# Patient Record
Sex: Male | Born: 1970 | Race: White | Hispanic: No | Marital: Married | Smoking: Never smoker
Health system: Southern US, Community
[De-identification: ages and names within clinical notes are randomized; demographics above are authoritative.]

## PROBLEM LIST (undated history)

## (undated) DIAGNOSIS — I82409 Acute embolism and thrombosis of unspecified deep veins of unspecified lower extremity: Secondary | ICD-10-CM

## (undated) DIAGNOSIS — I2699 Other pulmonary embolism without acute cor pulmonale: Secondary | ICD-10-CM

## (undated) HISTORY — PX: NO PAST SURGERIES: SHX2092

## (undated) HISTORY — DX: Acute embolism and thrombosis of unspecified deep veins of unspecified lower extremity: I82.409

## (undated) HISTORY — DX: Other pulmonary embolism without acute cor pulmonale: I26.99

---

## 2010-05-29 ENCOUNTER — Ambulatory Visit: Payer: Self-pay | Admitting: Internal Medicine

## 2013-10-03 ENCOUNTER — Ambulatory Visit: Payer: Self-pay | Admitting: Otolaryngology

## 2014-03-06 DIAGNOSIS — H918X1 Other specified hearing loss, right ear: Secondary | ICD-10-CM | POA: Insufficient documentation

## 2014-03-06 DIAGNOSIS — IMO0001 Reserved for inherently not codable concepts without codable children: Secondary | ICD-10-CM | POA: Insufficient documentation

## 2014-07-20 ENCOUNTER — Ambulatory Visit: Payer: Self-pay | Admitting: Family Medicine

## 2015-12-23 ENCOUNTER — Ambulatory Visit (INDEPENDENT_AMBULATORY_CARE_PROVIDER_SITE_OTHER): Payer: Managed Care, Other (non HMO) | Admitting: Family Medicine

## 2015-12-23 ENCOUNTER — Encounter: Payer: Self-pay | Admitting: Family Medicine

## 2015-12-23 DIAGNOSIS — Z0000 Encounter for general adult medical examination without abnormal findings: Secondary | ICD-10-CM

## 2015-12-23 DIAGNOSIS — Z0001 Encounter for general adult medical examination with abnormal findings: Secondary | ICD-10-CM | POA: Insufficient documentation

## 2015-12-23 NOTE — Assessment & Plan Note (Signed)
Declines flu.  States Tetanus is up to date. Patient to bring in screening labs from work.

## 2015-12-23 NOTE — Patient Instructions (Signed)
Get me a copy of your lab results.  Follow up annually (or sooner if needed).  Take care  Dr. Adriana Simasook   Health Maintenance, Male A healthy lifestyle and preventative care can promote health and wellness.  Maintain regular health, dental, and eye exams.  Eat a healthy diet. Foods like vegetables, fruits, whole grains, low-fat dairy products, and lean protein foods contain the nutrients you need and are low in calories. Decrease your intake of foods high in solid fats, added sugars, and salt. Get information about a proper diet from your health care provider, if necessary.  Regular physical exercise is one of the most important things you can do for your health. Most adults should get at least 150 minutes of moderate-intensity exercise (any activity that increases your heart rate and causes you to sweat) each week. In addition, most adults need muscle-strengthening exercises on 2 or more days a week.   Maintain a healthy weight. The body mass index (BMI) is a screening tool to identify possible weight problems. It provides an estimate of body fat based on height and weight. Your health care provider can find your BMI and can help you achieve or maintain a healthy weight. For males 20 years and older:  A BMI below 18.5 is considered underweight.  A BMI of 18.5 to 24.9 is normal.  A BMI of 25 to 29.9 is considered overweight.  A BMI of 30 and above is considered obese.  Maintain normal blood lipids and cholesterol by exercising and minimizing your intake of saturated fat. Eat a balanced diet with plenty of fruits and vegetables. Blood tests for lipids and cholesterol should begin at age 43 and be repeated every 5 years. If your lipid or cholesterol levels are high, you are over age 43, or you are at high risk for heart disease, you may need your cholesterol levels checked more frequently.Ongoing high lipid and cholesterol levels should be treated with medicines if diet and exercise are not  working.  If you smoke, find out from your health care provider how to quit. If you do not use tobacco, do not start.  Lung cancer screening is recommended for adults aged 55-80 years who are at high risk for developing lung cancer because of a history of smoking. A yearly low-dose CT scan of the lungs is recommended for people who have at least a 30-pack-year history of smoking and are current smokers or have quit within the past 15 years. A pack year of smoking is smoking an average of 1 pack of cigarettes a day for 1 year (for example, a 30-pack-year history of smoking could mean smoking 1 pack a day for 30 years or 2 packs a day for 15 years). Yearly screening should continue until the smoker has stopped smoking for at least 15 years. Yearly screening should be stopped for people who develop a health problem that would prevent them from having lung cancer treatment.  If you choose to drink alcohol, do not have more than 2 drinks per day. One drink is considered to be 12 oz (360 mL) of beer, 5 oz (150 mL) of wine, or 1.5 oz (45 mL) of liquor.  Avoid the use of street drugs. Do not share needles with anyone. Ask for help if you need support or instructions about stopping the use of drugs.  High blood pressure causes heart disease and increases the risk of stroke. High blood pressure is more likely to develop in:  People who have blood pressure  in the end of the normal range (100-139/85-89 mm Hg).  People who are overweight or obese.  People who are African American.  If you are 22-61 years of age, have your blood pressure checked every 3-5 years. If you are 30 years of age or older, have your blood pressure checked every year. You should have your blood pressure measured twice-once when you are at a hospital or clinic, and once when you are not at a hospital or clinic. Record the average of the two measurements. To check your blood pressure when you are not at a hospital or clinic, you can  use:  An automated blood pressure machine at a pharmacy.  A home blood pressure monitor.  If you are 24-104 years old, ask your health care provider if you should take aspirin to prevent heart disease.  Diabetes screening involves taking a blood sample to check your fasting blood sugar level. This should be done once every 3 years after age 34 if you are at a normal weight and without risk factors for diabetes. Testing should be considered at a younger age or be carried out more frequently if you are overweight and have at least 1 risk factor for diabetes.  Colorectal cancer can be detected and often prevented. Most routine colorectal cancer screening begins at the age of 11 and continues through age 26. However, your health care provider may recommend screening at an earlier age if you have risk factors for colon cancer. On a yearly basis, your health care provider may provide home test kits to check for hidden blood in the stool. A small camera at the end of a tube may be used to directly examine the colon (sigmoidoscopy or colonoscopy) to detect the earliest forms of colorectal cancer. Talk to your health care provider about this at age 34 when routine screening begins. A direct exam of the colon should be repeated every 5-10 years through age 47, unless early forms of precancerous polyps or small growths are found.  People who are at an increased risk for hepatitis B should be screened for this virus. You are considered at high risk for hepatitis B if:  You were born in a country where hepatitis B occurs often. Talk with your health care provider about which countries are considered high risk.  Your parents were born in a high-risk country and you have not received a shot to protect against hepatitis B (hepatitis B vaccine).  You have HIV or AIDS.  You use needles to inject street drugs.  You live with, or have sex with, someone who has hepatitis B.  You are a man who has sex with other  men (MSM).  You get hemodialysis treatment.  You take certain medicines for conditions like cancer, organ transplantation, and autoimmune conditions.  Hepatitis C blood testing is recommended for all people born from [birth year] through 1963 and any individual with known risk factors for hepatitis C.  Healthy men should no longer receive prostate-specific antigen (PSA) blood tests as part of routine cancer screening. Talk to your health care provider about prostate cancer screening.  Testicular cancer screening is not recommended for adolescents or adult males who have no symptoms. Screening includes self-exam, a health care provider exam, and other screening tests. Consult with your health care provider about any symptoms you have or any concerns you have about testicular cancer.  Practice safe sex. Use condoms and avoid high-risk sexual practices to reduce the spread of sexually transmitted infections (STIs).  You should be screened for STIs, including gonorrhea and chlamydia if:  You are sexually active and are younger than 24 years.  You are older than 24 years, and your health care provider tells you that you are at risk for this type of infection.  Your sexual activity has changed since you were last screened, and you are at an increased risk for chlamydia or gonorrhea. Ask your health care provider if you are at risk.  If you are at risk of being infected with HIV, it is recommended that you take a prescription medicine daily to prevent HIV infection. This is called pre-exposure prophylaxis (PrEP). You are considered at risk if:  You are a man who has sex with other men (MSM).  You are a heterosexual man who is sexually active with multiple partners.  You take drugs by injection.  You are sexually active with a partner who has HIV.  Talk with your health care provider about whether you are at high risk of being infected with HIV. If you choose to begin PrEP, you should first be tested  for HIV. You should then be tested every 3 months for as long as you are taking PrEP.  Use sunscreen. Apply sunscreen liberally and repeatedly throughout the day. You should seek shade when your shadow is shorter than you. Protect yourself by wearing long sleeves, pants, a wide-brimmed hat, and sunglasses year round whenever you are outdoors.  Tell your health care provider of new moles or changes in moles, especially if there is a change in shape or color. Also, tell your health care provider if a mole is larger than the size of a pencil eraser.  A one-time screening for abdominal aortic aneurysm (AAA) and surgical repair of large AAAs by ultrasound is recommended for men aged 15-75 years who are current or former smokers.  Stay current with your vaccines (immunizations). This information is not intended to replace advice given to you by your health care provider. Make sure you discuss any questions you have with your health care provider. Document Released: 07/18/2007 Document Revised: 02/09/2014 Document Reviewed: 10/23/2014 Elsevier Interactive Patient Education  2017 [employer].

## 2015-12-23 NOTE — Progress Notes (Signed)
Pre visit review using our clinic review tool, if applicable. No additional management support is needed unless otherwise documented below in the visit note. 

## 2015-12-23 NOTE — Progress Notes (Signed)
   Subjective:  Patient ID: Benjamin Dyer, male    DOB: 06-12-1970  Age: 45 y.o. MRN: [id number]  CC: Establish careHall Busing/physical exam  HPI Hall Busingravis Beau Dyer is a 45 y.o. male presents to the clinic today to establish care. He is in need of physical exam.  Preventative Healthcare  Colonoscopy: Not indicated.   Immunizations  Tetanus - States it was in the last 10 years.  Flu - Declines.   Labs: Has had recent labs through work. Will obtain records.   Exercise: Exercises daily.  Alcohol use: No.  Smoking/tobacco use: No.  PMH, Surgical Hx, Family Hx, Social History reviewed and updated as below.  PMH - Denies any PMH.   Past Surgical History:  Procedure Laterality Date  . NO PAST SURGERIES     Family History  Problem Relation Age of Onset  . Arthritis Mother   . Prostate cancer Father   . Mental illness Sister   . Diabetes Maternal Uncle   . Stroke Paternal Aunt    Social History  Substance Use Topics  . Smoking status: Never Smoker  . Smokeless tobacco: Never Used  . Alcohol use No   Review of Systems  HENT: Positive for tinnitus.   All other systems reviewed and are negative.  Objective:   Today's Vitals: BP 112/68   Pulse (!) 43   Temp 98.4 F (36.9 C) (Oral)   Ht 6\' 4"  (1.93 m)   Wt 249 lb 1.9 oz (113 kg)   SpO2 97%   BMI 30.32 kg/m   Physical Exam  Constitutional: He is oriented to person, place, and time. He appears well-developed and well-nourished. No distress.  HENT:  Head: Normocephalic and atraumatic.  Nose: Nose normal.  Mouth/Throat: Oropharynx is clear and moist. No oropharyngeal exudate.  Normal TM's bilaterally.   Eyes: Conjunctivae are normal. No scleral icterus.  Neck: Neck supple.  Cardiovascular: Normal rate and regular rhythm.   No murmur heard. Pulmonary/Chest: Effort normal and breath sounds normal. He has no wheezes. He has no rales.  Abdominal: Soft. He exhibits no distension. There is no tenderness. There is no  rebound and no guarding.  Musculoskeletal: Normal range of motion. He exhibits no edema.  Lymphadenopathy:    He has no cervical adenopathy.  Neurological: He is alert and oriented to person, place, and time.  Skin: Skin is warm and dry. No rash noted.  Psychiatric: He has a normal mood and affect.  Vitals reviewed.  Assessment & Plan:   Problem List Items Addressed This Visit    Annual physical exam    Declines flu.  States Tetanus is up to date. Patient to bring in screening labs from work.          Outpatient Encounter Prescriptions as of 12/23/2015  Medication Sig  . Multiple Vitamin (MULTIVITAMIN) tablet Take 1 tablet by mouth daily.   No facility-administered encounter medications on file as of 12/23/2015.     Follow-up: Annually  Everlene OtherJayce Lige Lakeman DO [facility] [place]

## 2016-06-24 ENCOUNTER — Ambulatory Visit (INDEPENDENT_AMBULATORY_CARE_PROVIDER_SITE_OTHER): Payer: Managed Care, Other (non HMO) | Admitting: Family Medicine

## 2016-06-24 ENCOUNTER — Encounter: Payer: Self-pay | Admitting: Family Medicine

## 2016-06-24 DIAGNOSIS — Z0000 Encounter for general adult medical examination without abnormal findings: Secondary | ICD-10-CM | POA: Diagnosis not present

## 2016-06-24 NOTE — Assessment & Plan Note (Signed)
Preventative health care up to date. Declines HIV screening. Advised to bring me annual labs through work. Consider PSA screening in the future. Medically cleared to work (he works for the [employer]).

## 2016-06-24 NOTE — Progress Notes (Signed)
Pre visit review using our clinic review tool, if applicable. No additional management support is needed unless otherwise documented below in the visit note. 

## 2016-06-24 NOTE — Progress Notes (Signed)
   Subjective:  Patient ID: Benjamin Dyer, male    DOB: 29-Oct-1970  Age: 46 y.o. MRN: [id number]  CC: Annual exam  HPI Benjamin Dyer is a 46 y.o. male presents to the clinic today for an annual exam.   Preventative Healthcare  Colonoscopy: Not indicated at this time.  Immunizations: Up to date.   Prostate cancer screening: Family hx of. Patient to consider.  Labs: Has annual labs through job. Advised to bring me a copy.  Exercise: Exercises daily.  Alcohol use: No.  Smoking/tobacco use: No.  STD/HIV testing: Declines.   PMH, Surgical Hx, Family Hx, Social History reviewed and updated as below.  No reported PMH.  Past Surgical History:  Procedure Laterality Date  . NO PAST SURGERIES     Family History  Problem Relation Age of Onset  . Arthritis Mother   . Prostate cancer Father   . Mental illness Sister   . Diabetes Maternal Uncle   . Stroke Paternal Aunt    Social History  Substance Use Topics  . Smoking status: Never Smoker  . Smokeless tobacco: Never Used  . Alcohol use No   Review of Systems  HENT: Positive for tinnitus.   All other systems reviewed and are negative.   Objective:   Today's Vitals: BP 110/64 (BP Location: Left Arm, Patient Position: Sitting, Cuff Size: Large)   Pulse (!) 48   Temp 97.6 F (36.4 C) (Oral)   Ht 6\' 4"  (1.93 m)   Wt 243 lb (110.2 kg)   SpO2 96%   BMI 29.58 kg/m   Physical Exam  Constitutional: He is oriented to person, place, and time. He appears well-developed and well-nourished. No distress.  HENT:  Head: Normocephalic and atraumatic.  Nose: Nose normal.  Mouth/Throat: Oropharynx is clear and moist. No oropharyngeal exudate.  Normal TM's bilaterally.   Eyes: Conjunctivae are normal. No scleral icterus.  Neck: Neck supple.  Cardiovascular: Regular rhythm.   No murmur heard. Bradycardia.   Pulmonary/Chest: Effort normal and breath sounds normal. He has no wheezes. He has no rales.  Abdominal:  Soft. He exhibits no distension. There is no tenderness. There is no rebound and no guarding.  Musculoskeletal: Normal range of motion. He exhibits no edema.  Lymphadenopathy:    He has no cervical adenopathy.  Neurological: He is alert and oriented to person, place, and time.  Skin: Skin is warm and dry. No rash noted.  Psychiatric: He has a normal mood and affect.  Vitals reviewed.  Assessment & Plan:   Problem List Items Addressed This Visit    Annual physical exam    Preventative health care up to date. Declines HIV screening. Advised to bring me annual labs through work. Consider PSA screening in the future. Medically cleared to work (he works for the [employer]).        Follow-up: Annually  Everlene OtherJayce Marceline Napierala DO [facility] [place]

## 2016-10-20 ENCOUNTER — Encounter: Payer: Self-pay | Admitting: Podiatry

## 2016-10-20 ENCOUNTER — Ambulatory Visit (INDEPENDENT_AMBULATORY_CARE_PROVIDER_SITE_OTHER): Payer: No Typology Code available for payment source

## 2016-10-20 ENCOUNTER — Ambulatory Visit: Payer: No Typology Code available for payment source

## 2016-10-20 ENCOUNTER — Ambulatory Visit (INDEPENDENT_AMBULATORY_CARE_PROVIDER_SITE_OTHER): Payer: No Typology Code available for payment source | Admitting: Podiatry

## 2016-10-20 DIAGNOSIS — M79671 Pain in right foot: Secondary | ICD-10-CM

## 2016-10-20 DIAGNOSIS — S92324A Nondisplaced fracture of second metatarsal bone, right foot, initial encounter for closed fracture: Secondary | ICD-10-CM | POA: Diagnosis not present

## 2016-10-20 MED ORDER — DICLOFENAC SODIUM 75 MG PO TBEC
75.0000 mg | DELAYED_RELEASE_TABLET | Freq: Two times a day (BID) | ORAL | 0 refills | Status: DC
Start: 2016-10-20 — End: 2016-11-17

## 2016-10-20 NOTE — Progress Notes (Signed)
   Subjective:    Patient ID: Benjamin Dyer, male    DOB: 1970-12-26, 46 y.o.   MRN: [id number]  HPI    Review of Systems     Objective:   Physical Exam        Assessment & Plan:

## 2016-10-23 NOTE — Progress Notes (Signed)
   HPI: Patient is a 46 year old male presenting today as a new patient with a complaint of an injury to the right foot that occurred approximately one week ago. He states he was playing basketball when the injury occurred. He now reports it feels as if he is walking on rocks. He states the pain is located to the second toe of the right foot and the plantar forefoot as well. Walking and standing increases the pain. He has iced the area and taken ibuprofen for treatment. He is here for further evaluation and treatment.  No past medical history on file.   Physical Exam: General: The patient is alert and oriented x3 in no acute distress.  Dermatology: Skin is warm, dry and supple bilateral lower extremities. Negative for open lesions or macerations.  Vascular: Palpable pedal pulses bilaterally. No edema or erythema noted. Capillary refill within normal limits.  Neurological: Epicritic and protective threshold grossly intact bilaterally.   Musculoskeletal Exam: Pain on palpation with moderate edema localized to the second metatarsal area. Range of motion within normal limits to all pedal and ankle joints bilateral. Muscle strength 5/5 in all groups bilateral.   Radiographic Exam:  Closed, nondisplaced fracture of the second metatarsal neck of the right foot.    Assessment: - Fracture of the second metatarsal neck right   Plan of Care:  - Patient evaluated. X-rays reviewed. - Cam boot dispensed. Weightbearing as tolerated in cam boot. Wear boot as much as work will allow. - Compression anklet dispensed. - Prescription for diclofenac given to patient. - Return to clinic in 4 weeks.  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 Patient goes by Artist Pais, DPM [facility]  Dr. Felecia Shelling, DPM    [address] [place], [place] [address]                Office [phone]  Fax [phone]

## 2016-11-17 ENCOUNTER — Ambulatory Visit (INDEPENDENT_AMBULATORY_CARE_PROVIDER_SITE_OTHER): Payer: No Typology Code available for payment source

## 2016-11-17 ENCOUNTER — Ambulatory Visit (INDEPENDENT_AMBULATORY_CARE_PROVIDER_SITE_OTHER): Payer: No Typology Code available for payment source | Admitting: Podiatry

## 2016-11-17 ENCOUNTER — Encounter: Payer: Self-pay | Admitting: Podiatry

## 2016-11-17 DIAGNOSIS — S92324D Nondisplaced fracture of second metatarsal bone, right foot, subsequent encounter for fracture with routine healing: Secondary | ICD-10-CM

## 2016-11-17 DIAGNOSIS — S92324A Nondisplaced fracture of second metatarsal bone, right foot, initial encounter for closed fracture: Secondary | ICD-10-CM

## 2016-11-17 MED ORDER — DICLOFENAC SODIUM 75 MG PO TBEC: 75.0000 mg | DELAYED_RELEASE_TABLET | Freq: Two times a day (BID) | ORAL | 0 refills | Status: DC

## 2016-11-18 NOTE — Progress Notes (Signed)
   HPI: Patient is a 46 year old male presenting today for follow-up evaluation of a fracture to the second metatarsal neck of the right foot. He states his pain has improved some but is still experiencing some continued pain to the second toe of the right foot that is beginning to radiate to the third toe. He rates the pain at 2/10. He has been taking ibuprofen with some relief. He denies any new trauma or injury. He denies any new complaints at this time. He is here for further evaluation and treatment.   No past medical history on file.   Physical Exam: General: The patient is alert and oriented x3 in no acute distress.  Dermatology: Skin is warm, dry and supple bilateral lower extremities. Negative for open lesions or macerations.  Vascular: Palpable pedal pulses bilaterally. No edema or erythema noted. Capillary refill within normal limits.  Neurological: Epicritic and protective threshold grossly intact bilaterally.   Musculoskeletal Exam: Pain on palpation with minimal edema localized to the second metatarsal area. Range of motion within normal limits to all pedal and ankle joints bilateral. Muscle strength 5/5 in all groups bilateral.   Radiographic Exam:  Closed, nondisplaced fracture of the second metatarsal neck of the right foot with routine healing.  Assessment: - Fracture of the second metatarsal neck right-healing   Plan of Care:  - Patient evaluated. X-rays reviewed. - Continue wearing good boots. - Recommended Ecco boots. - Continue taking diclofenac as directed. - Request for preauthorization for orthotics placed today. - Return to clinic when necessary.  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 Patient goes by Artist PaisBeau   Brent M. Evans, DPM [facility]  Dr. Felecia ShellingBrent M. Evans, DPM    [address]                                        [place], [place] [id number]                Office [phone]  Fax [phone]

## 2017-06-25 ENCOUNTER — Ambulatory Visit (INDEPENDENT_AMBULATORY_CARE_PROVIDER_SITE_OTHER): Payer: PRIVATE HEALTH INSURANCE | Admitting: Family Medicine

## 2017-06-25 ENCOUNTER — Encounter: Payer: Managed Care, Other (non HMO) | Admitting: Family Medicine

## 2017-06-25 ENCOUNTER — Encounter: Payer: Self-pay | Admitting: Family Medicine

## 2017-06-25 VITALS — BP 108/70 | HR 54 | Temp 97.6°F | Ht 76.5 in | Wt 259.8 lb

## 2017-06-25 DIAGNOSIS — Z0000 Encounter for general adult medical examination without abnormal findings: Secondary | ICD-10-CM

## 2017-06-25 DIAGNOSIS — Z125 Encounter for screening for malignant neoplasm of prostate: Secondary | ICD-10-CM

## 2017-06-25 DIAGNOSIS — Z1329 Encounter for screening for other suspected endocrine disorder: Secondary | ICD-10-CM | POA: Diagnosis not present

## 2017-06-25 LAB — COMPREHENSIVE METABOLIC PANEL
ALBUMIN: 4.5 g/dL (ref 3.5–5.2)
ALT: 29 U/L (ref 0–53)
AST: 20 U/L (ref 0–37)
Alkaline Phosphatase: 67 U/L (ref 39–117)
BUN: 21 mg/dL (ref 6–23)
CALCIUM: 9.6 mg/dL (ref 8.4–10.5)
CHLORIDE: 101 meq/L (ref 96–112)
CO2: 31 mEq/L (ref 19–32)
Creatinine, Ser: 1.17 mg/dL (ref 0.40–1.50)
GFR: 71.62 mL/min (ref >60.00)
Glucose, Bld: 98 mg/dL (ref 70–99)
POTASSIUM: 4.7 meq/L (ref 3.5–5.1)
SODIUM: 138 meq/L (ref 135–145)
TOTAL PROTEIN: 6.9 g/dL (ref 6.0–8.3)
Total Bilirubin: 1 mg/dL (ref 0.2–1.2)

## 2017-06-25 LAB — PSA: PSA: 0.36 ng/mL (ref 0.10–4.00)

## 2017-06-25 LAB — TSH: TSH: 0.88 u[IU]/mL (ref 0.35–4.50)

## 2017-06-25 NOTE — Patient Instructions (Signed)
Nice to see you. We will check some lab work today and contact you with the results. Please get [place] a copy of your recent cholesterol panel. Please try to find out when her last tetanus shot was. Please stay active and monitor your diet.

## 2017-06-25 NOTE — Progress Notes (Signed)
Benjamin Rumps, MD Phone: [phone]  Benjamin Dyer is a 47 y.o. male who presents today for cpe.  Exercises by doing CrossFit and cardio.  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  Please mask well. Tries to stay away from bread.  Eats high protein.  Healthy carbs.  Mostly drinks water. Reports heart rate is always been below 60.  Increases with exercise.  He has no difficulty performing any physical activity. Tetanus vaccination he thinks was done 9 years ago. Does have a family history of prostate cancer in his father.  No family history of colon cancer.  His labs done through work last October. No alcohol use, illicit drug use, or tobacco use. He sees a Pharmacist, community twice a year. No eye issues so does not see an eye doctor. He has chronic issues with hearing in his right ear that has been evaluated by ENT previously.  It has not changed.  Active Ambulatory Problems    Diagnosis Date Noted  . Annual physical exam 12/23/2015  . 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 No Resolved Ambulatory Problems   No Additional Past Medical History    Family History  Problem Relation Age of Onset  . Arthritis Mother   . Prostate cancer Father   . Mental illness Sister   . Diabetes Maternal Uncle   . Stroke Paternal Aunt     Social History   Socioeconomic History  . Marital status: Married    Spouse name: Not on file  . Number of children: Not on file  . Years of education: Not on file  . Highest education level: Not on file  Occupational History  . Not on file  Social Needs  . Financial resource strain: Not on file  . Food insecurity:    Worry: Not on file    Inability: Not on file  . Transportation needs:    Medical: Not on file    Non-medical: Not on file  Tobacco Use  . Smoking status: Never Smoker  . Smokeless tobacco: Never Used  Substance and Sexual Activity  . Alcohol use: No  . Drug use: No    . Sexual activity: Not on file  Lifestyle  . Physical activity:    Days per week: Not on file    Minutes per session: Not on file  . Stress: Not on file  Relationships  . Social connections:    Talks on phone: Not on file    Gets together: Not on file    Attends religious service: Not on file    Active member of club or organization: Not on file    Attends meetings of clubs or organizations: Not on file    Relationship status: Not on file  . Intimate partner violence:    Fear of current or ex partner: Not on file    Emotionally abused: Not on file    Physically abused: Not on file    Forced sexual activity: Not on file  Other Topics Concern  . Not on file  Social History Narrative  . Not on file    ROS  General:  Negative for nexplained weight loss, fever Skin: Negative for new or changing mole, sore that won't heal HEENT: Positive for ringing in his ears and trouble hearing, negative for trouble seeing, mouth sores, hoarseness, change in voice, dysphagia. CV:  Negative for chest pain, dyspnea, edema, palpitations Resp: Negative for cough,  dyspnea, hemoptysis GI: Negative for nausea, vomiting, diarrhea, constipation, abdominal pain, melena, hematochezia. GU: Negative for dysuria, incontinence, urinary hesitance, hematuria, vaginal or penile discharge, polyuria, sexual difficulty, lumps in testicle or breasts MSK: Negative for muscle cramps or aches, joint pain or swelling Neuro: Negative for headaches, weakness, numbness, dizziness, passing out/fainting Psych: Negative for depression, anxiety, memory problems  Objective  Physical Exam Vitals:   06/25/17 0916  BP: 108/70  Pulse: (!) 54  Temp: 97.6 F (36.4 C)  SpO2: 98%    BP Readings from Last 3 Encounters:  06/25/17 108/70  06/24/16 110/64  12/23/15 112/68   Wt Readings from Last 3 Encounters:  06/25/17 259 lb 12.8 oz (117.8 kg)  06/24/16 243 lb (110.2 kg)  12/23/15 249 lb 1.9 oz (113 kg)    Physical  Exam  Constitutional: No distress.  HENT:  Head: Normocephalic and atraumatic.  Mouth/Throat: Oropharynx is clear and moist.  Eyes: Pupils are equal, round, and reactive to light. Conjunctivae are normal.  Neck: Neck supple.  Cardiovascular: Normal rate, regular rhythm and normal heart sounds.  Pulmonary/Chest: Effort normal and breath sounds normal.  Abdominal: Soft. Bowel sounds are normal. He exhibits no distension. There is no tenderness.  Genitourinary: Rectum normal and prostate normal.  Musculoskeletal: He exhibits no edema.  Lymphadenopathy:    He has no cervical adenopathy.  Neurological: He is alert.  Skin: Skin is warm and dry. He is not diaphoretic.  Psychiatric: He has a normal mood and affect.     Assessment/Plan:   Annual physical exam Physical exam completed.  Encouraged continued diet and exercise.  He will try to get his tetanus vaccination records.  Discussed prostate cancer screening with him and this will be completed today.  Normal rectal exam.  He will attempt to get [place] his cholesterol panel from work.  Labs as outlined below.  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   Orders Placed This Encounter  Procedures  . Comp Met (CMET)  . TSH  . PSA    No orders of the defined types were placed in this encounter.    Benjamin Rumps, MD [place]

## 2017-06-25 NOTE — Assessment & Plan Note (Signed)
Physical exam completed.  Encouraged continued diet and exercise.  He will try to get his tetanus vaccination records.  Discussed prostate cancer screening with him and this will be completed today.  Normal rectal exam.  He will attempt to get [place] his cholesterol panel from work.  Labs as outlined below.  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

## 2018-06-28 ENCOUNTER — Ambulatory Visit (INDEPENDENT_AMBULATORY_CARE_PROVIDER_SITE_OTHER): Payer: PRIVATE HEALTH INSURANCE

## 2018-06-28 ENCOUNTER — Ambulatory Visit (INDEPENDENT_AMBULATORY_CARE_PROVIDER_SITE_OTHER): Payer: PRIVATE HEALTH INSURANCE | Admitting: Family Medicine

## 2018-06-28 ENCOUNTER — Encounter: Payer: Self-pay | Admitting: Family Medicine

## 2018-06-28 ENCOUNTER — Other Ambulatory Visit: Payer: Self-pay

## 2018-06-28 VITALS — BP 124/70 | HR 51 | Temp 98.0°F | Resp 15 | Ht 76.5 in | Wt 271.0 lb

## 2018-06-28 DIAGNOSIS — R0781 Pleurodynia: Secondary | ICD-10-CM

## 2018-06-28 DIAGNOSIS — Z13220 Encounter for screening for lipoid disorders: Secondary | ICD-10-CM | POA: Diagnosis not present

## 2018-06-28 DIAGNOSIS — Z0001 Encounter for general adult medical examination with abnormal findings: Secondary | ICD-10-CM | POA: Diagnosis not present

## 2018-06-28 DIAGNOSIS — R0609 Other forms of dyspnea: Secondary | ICD-10-CM | POA: Diagnosis not present

## 2018-06-28 DIAGNOSIS — Z125 Encounter for screening for malignant neoplasm of prostate: Secondary | ICD-10-CM | POA: Diagnosis not present

## 2018-06-28 DIAGNOSIS — L989 Disorder of the skin and subcutaneous tissue, unspecified: Secondary | ICD-10-CM

## 2018-06-28 LAB — COMPREHENSIVE METABOLIC PANEL
ALT: 23 U/L (ref 0–53)
AST: 16 U/L (ref 0–37)
Albumin: 4.4 g/dL (ref 3.5–5.2)
Alkaline Phosphatase: 76 U/L (ref 39–117)
BUN: 17 mg/dL (ref 6–23)
CO2: 29 mEq/L (ref 19–32)
Calcium: 9.5 mg/dL (ref 8.4–10.5)
Chloride: 104 mEq/L (ref 96–112)
Creatinine, Ser: 0.97 mg/dL (ref 0.40–1.50)
GFR: 83.28 mL/min (ref >60.00)
Glucose, Bld: 91 mg/dL (ref 70–99)
Potassium: 4.5 mEq/L (ref 3.5–5.1)
Sodium: 139 mEq/L (ref 135–145)
Total Bilirubin: 0.7 mg/dL (ref 0.2–1.2)
Total Protein: 6.6 g/dL (ref 6.0–8.3)

## 2018-06-28 LAB — CBC
HCT: 43.5 % (ref 39.0–52.0)
Hemoglobin: 15.2 g/dL (ref 13.0–17.0)
MCHC: 35 g/dL (ref 30.0–36.0)
MCV: 89.2 fl (ref 78.0–100.0)
Platelets: 182 10*3/uL (ref 150.0–400.0)
RBC: 4.88 Mil/uL (ref 4.22–5.81)
RDW: 13 % (ref 11.5–15.5)
WBC: 4.5 10*3/uL (ref 4.0–10.5)

## 2018-06-28 LAB — PSA: PSA: 0.5 ng/mL (ref 0.10–4.00)

## 2018-06-28 LAB — LIPID PANEL
Cholesterol: 182 mg/dL (ref 0–200)
HDL: 51.6 mg/dL (ref >39.00)
LDL Cholesterol: 111 mg/dL — ABNORMAL HIGH (ref 0–99)
NonHDL: 130.06
Total CHOL/HDL Ratio: 4
Triglycerides: 94 mg/dL (ref 0.0–149.0)
VLDL: 18.8 mg/dL (ref 0.0–40.0)

## 2018-06-28 NOTE — Patient Instructions (Addendum)
Nice to see you.  We will get lab work today and a chest x-ray. We will contact you with the results.  I will refer you to cardiology and send the EKG to them to review.  Please monitor your symptoms and if you develop chest pain, recurrent shortness of breath, or any additional new symptoms please seek medical attention.

## 2018-06-28 NOTE — Assessment & Plan Note (Addendum)
Physical exam completed.  Encouraged dietary changes and monitoring his exercise.  Prostate cancer screening and other lab work as outlined below.  Encourage patient to see an ophthalmologist.

## 2018-06-28 NOTE — Progress Notes (Signed)
[name], MD Phone: [phone]  Benjamin Dyer is a 48 y.o. male who presents today for CPE.  Diet: Not eating as good as he should.  He has been drinking more coffee recently. Exercise: Exercises once or twice daily.  Does CrossFit stuff with work. Vaccinations: Patient is unsure when his last tetanus vaccine was. Family history colon or prostate cancer: Does have a family history of prostate cancer.  No family history of colon cancer. Tobacco use: No Alcohol use: No Illicit drug use: No Dentist: Sees twice yearly Ophthalmology: Does not follow with ophthalmology  Exertional dyspnea: Patient notes over the last 4 to 5 months he has had some episodes where he has felt short of breath while he has been working out.  It only occurs when he is doing significant high intensity exercise such as CrossFit at work.  Most of the time he will have no symptoms.  He notes he will have to rest for 30 to 40 seconds and it would get better.  He feels a panicky sensation when this occurs.  He does feel that pulling down on his shirt and getting his collar away from his neck helps.  He notes no anxiety.  No depression.  No chest pain.  No orthopnea or PND.  No cough, wheezing, fever, travel, or known COVID-19 exposure.  He feels as though this improves if he consistently works out and typically only bothers him if he has been inactive for most of the day. He does note he has gained some weight.  He does note an injury to his left lower ribs while playing basketball that occurred around the time that this dyspnea started.  He does note the pain has improved quite a bit though he does still feel as though there may be something in that area.  He has not had any dyspnea in the past 1.5 weeks.   Skin lesions: Patient notes skin lesions over his right temple that have been there for years that do not appear to go away.  They are slightly erythematous and rough.  He also notes his wife advised there was a  new spot on his back.  He has been unable to visualize that.  Patient has chronic trouble hearing.  He has been evaluated by ENT for this.  He does report some issues with his vision and having to hold his watch a certain way to be able to see it clearly.  He has not seen ophthalmology.  Active Ambulatory Problems    Diagnosis Date Noted   Encounter for general adult medical examination with abnormal findings 12/23/2015   Asymmetrical hearing loss, right 03/06/2014   Exertional dyspnea 06/30/2018   Rib pain 06/30/2018   Skin lesion 06/30/2018   Resolved Ambulatory Problems    Diagnosis Date Noted   No Resolved Ambulatory Problems   No Additional Past Medical History    Family History  Problem Relation Age of Onset   Arthritis Mother    Prostate cancer Father    Mental illness Sister    Diabetes Maternal Uncle    Stroke Paternal Aunt     Social History   Socioeconomic History   Marital status: Married    Spouse name: Not on file   Number of children: Not on file   Years of education: Not on file   Highest education level: Not on file  Occupational History   Not on file  Social Needs   Financial resource strain: Not on file  Food insecurity:    Worry: Not on file    Inability: Not on file   Transportation needs:    Medical: Not on file    Non-medical: Not on file  Tobacco Use   Smoking status: Never Smoker   Smokeless tobacco: Never Used  Substance and Sexual Activity   Alcohol use: No   Drug use: No   Sexual activity: Not on file  Lifestyle   Physical activity:    Days per week: Not on file    Minutes per session: Not on file   Stress: Not on file  Relationships   Social connections:    Talks on phone: Not on file    Gets together: Not on file    Attends religious service: Not on file    Active member of club or organization: Not on file    Attends meetings of clubs or organizations: Not on file    Relationship status: Not  on file   Intimate partner violence:    Fear of current or ex partner: Not on file    Emotionally abused: Not on file    Physically abused: Not on file    Forced sexual activity: Not on file  Other Topics Concern   Not on file  Social History Narrative   Not on file    ROS  General:  Negative for nexplained weight loss, fever Skin: Positive for new or changing mole, negative for sore that won't heal HEENT: Positive for trouble hearing, trouble seeing, ringing in ears, negative for mouth sores, hoarseness, change in voice, dysphagia. CV:  Negative for chest pain, edema, palpitations Resp: Positive for exertional dyspnea, negative for cough, hemoptysis GI: Negative for nausea, vomiting, diarrhea, constipation, abdominal pain, melena, hematochezia. GU: Negative for dysuria, incontinence, urinary hesitance, hematuria, vaginal or penile discharge, polyuria, sexual difficulty, lumps in testicle or breasts MSK: Negative for muscle cramps or aches, joint pain or swelling Neuro: Negative for headaches, weakness, numbness, dizziness, passing out/fainting Psych: Negative for depression, anxiety, memory problems  Objective  Physical Exam Vitals:   06/28/18 0911  BP: 124/70  Pulse: (!) 51  Resp: 15  Temp: 98 F (36.7 C)  SpO2: 98%    BP Readings from Last 3 Encounters:  06/28/18 124/70  06/25/17 108/70  06/24/16 110/64   Wt Readings from Last 3 Encounters:  06/28/18 271 lb (122.9 kg)  06/25/17 259 lb 12.8 oz (117.8 kg)  06/24/16 243 lb (110.2 kg)    Physical Exam Constitutional:      General: He is not in acute distress.    Appearance: He is not diaphoretic.  HENT:     Head: Normocephalic and atraumatic.  Eyes:     Conjunctiva/sclera: Conjunctivae normal.     Pupils: Pupils are equal, round, and reactive to light.  Cardiovascular:     Rate and Rhythm: Normal rate and regular rhythm.     Heart sounds: Normal heart sounds.  Pulmonary:     Effort: Pulmonary effort is  normal.     Breath sounds: Normal breath sounds.     Comments: There is no tenderness or bony defects over his left lower ribs Abdominal:     General: Bowel sounds are normal. There is no distension.     Palpations: Abdomen is soft.     Tenderness: There is no abdominal tenderness.  Musculoskeletal:     Right lower leg: No edema.     Left lower leg: No edema.  Lymphadenopathy:     Cervical: No  cervical adenopathy.  Skin:    General: Skin is warm and dry.     Comments: Small slightly erythematous rough lesions over his right temple  Neurological:     Mental Status: He is alert.  Psychiatric:        Mood and Affect: Mood normal.        Behavior: Behavior normal.    EKG: Sinus bradycardia with a rate of 48, incomplete right bundle branch block, anterolateral ST elevation with repolarization variant  Assessment/Plan:   Encounter for general adult medical examination with abnormal findings Physical exam completed.  Encouraged dietary changes and monitoring his exercise.  Prostate cancer screening and other lab work as outlined below.  Encourage patient to see an ophthalmologist.  Exertional dyspnea Seems to be fairly atypical and could be related to possible deconditioning and weight gain given that the symptoms typically only occur when he has been inactive for a period of time and improved with increased activity.  It would seem less likely to be related to a cardiac cause given improvement with increased activity.  EKG does not reveal any ischemic changes though does appear to have repolarization variant ST elevation.  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  He also has sinus bradycardia which has been consistent in his vital signs for several years.  I will forward the EKG to 1 of our cardiology colleagues for them to review.  We will consider cardiology evaluation once they have reviewed the EKG.  Will obtain a chest x-ray to rule out an underlying cause from his prior  rib injury.  We will additionally obtain lab work.  He is given return precautions.  Rib pain Check chest x-ray rule out underlying rib injury.  Skin lesion Refer to dermatology.   Orders Placed This Encounter  Procedures   DG Chest 2 View    Standing Status:   Future    Number of Occurrences:   1    Standing Expiration Date:   08/28/2019    Order Specific Question:   Reason for Exam (SYMPTOM  OR DIAGNOSIS REQUIRED)    Answer:   rib injury, 5 months ago, continued mild discomfort, occasional exertional dyspnea    Order Specific Question:   Preferred imaging location?    Answer:   [facility] Specific Question:   Radiology Contrast Protocol - do NOT remove file path    Answer:   [email or web address]   CBC   Comp Met (CMET)   PSA   Lipid panel   Ambulatory referral to Dermatology    Referral Priority:   Routine    Referral Type:   Consultation    Referral Reason:   Specialty Services Required    Requested Specialty:   Dermatology    Number of Visits Requested:   1   EKG 12-Lead    No orders of the defined types were placed in this encounter.    [name], MD [place]

## 2018-06-30 ENCOUNTER — Telehealth: Payer: Self-pay | Admitting: Family Medicine

## 2018-06-30 DIAGNOSIS — L989 Disorder of the skin and subcutaneous tissue, unspecified: Secondary | ICD-10-CM | POA: Insufficient documentation

## 2018-06-30 DIAGNOSIS — R0781 Pleurodynia: Secondary | ICD-10-CM | POA: Insufficient documentation

## 2018-06-30 DIAGNOSIS — R0609 Other forms of dyspnea: Secondary | ICD-10-CM | POA: Insufficient documentation

## 2018-06-30 NOTE — Assessment & Plan Note (Signed)
Refer to dermatology 

## 2018-06-30 NOTE — Telephone Encounter (Signed)
-----   Message from [place] Ouch, MD sent at 06/30/2018  1:34 PM EDT ----- [name], I agree with you.  I do not really see anything alarming on his EKG.  He has an incomplete right bundle branch block which is considered a normal variant with mild nonspecific early repolarization.  Chronic bradycardia without symptoms is not concerning.  MA

## 2018-06-30 NOTE — Assessment & Plan Note (Signed)
Check chest x-ray rule out underlying rib injury.

## 2018-06-30 NOTE — Telephone Encounter (Signed)
The patient know that I heard back from cardiology after they reviewed his EKG.  They did not see anything alarming on his EKG and agreed with my interpretation of his EKG.  If the patient continues to have issues with his breathing we could refer him to cardiology.

## 2018-06-30 NOTE — Assessment & Plan Note (Signed)
Seems to be fairly atypical and could be related to possible deconditioning and weight gain given that the symptoms typically only occur when he has been inactive for a period of time and improved with increased activity.  It would seem less likely to be related to a cardiac cause given improvement with increased activity.  EKG does not reveal any ischemic changes though does appear to have repolarization variant ST elevation.  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  He also has sinus bradycardia which has been consistent in his vital signs for several years.  I will forward the EKG to 1 of our cardiology colleagues for them to review.  We will consider cardiology evaluation once they have reviewed the EKG.  Will obtain a chest x-ray to rule out an underlying cause from his prior rib injury.  We will additionally obtain lab work.  He is given return precautions.

## 2018-07-01 NOTE — Telephone Encounter (Signed)
Pt is aware and will call office if breathing problems persist.

## 2019-06-30 ENCOUNTER — Ambulatory Visit (INDEPENDENT_AMBULATORY_CARE_PROVIDER_SITE_OTHER): Payer: PRIVATE HEALTH INSURANCE | Admitting: Family Medicine

## 2019-06-30 ENCOUNTER — Encounter: Payer: Self-pay | Admitting: Family Medicine

## 2019-06-30 ENCOUNTER — Other Ambulatory Visit: Payer: Self-pay

## 2019-06-30 VITALS — BP 120/70 | HR 51 | Temp 96.7°F | Ht 76.0 in | Wt 264.2 lb

## 2019-06-30 DIAGNOSIS — Z13220 Encounter for screening for lipoid disorders: Secondary | ICD-10-CM | POA: Diagnosis not present

## 2019-06-30 DIAGNOSIS — Z23 Encounter for immunization: Secondary | ICD-10-CM

## 2019-06-30 DIAGNOSIS — Z125 Encounter for screening for malignant neoplasm of prostate: Secondary | ICD-10-CM | POA: Diagnosis not present

## 2019-06-30 DIAGNOSIS — E669 Obesity, unspecified: Secondary | ICD-10-CM

## 2019-06-30 DIAGNOSIS — S39012A Strain of muscle, fascia and tendon of lower back, initial encounter: Secondary | ICD-10-CM | POA: Insufficient documentation

## 2019-06-30 DIAGNOSIS — Z0001 Encounter for general adult medical examination with abnormal findings: Secondary | ICD-10-CM

## 2019-06-30 DIAGNOSIS — Z1211 Encounter for screening for malignant neoplasm of colon: Secondary | ICD-10-CM

## 2019-06-30 LAB — COMPREHENSIVE METABOLIC PANEL
ALT: 31 U/L (ref 0–53)
AST: 32 U/L (ref 0–37)
Albumin: 4.8 g/dL (ref 3.5–5.2)
Alkaline Phosphatase: 69 U/L (ref 39–117)
BUN: 20 mg/dL (ref 6–23)
CO2: 29 mEq/L (ref 19–32)
Calcium: 9.7 mg/dL (ref 8.4–10.5)
Chloride: 103 mEq/L (ref 96–112)
Creatinine, Ser: 1.16 mg/dL (ref 0.40–1.50)
GFR: 67.45 mL/min (ref >60.00)
Glucose, Bld: 80 mg/dL (ref 70–99)
Potassium: 4.1 mEq/L (ref 3.5–5.1)
Sodium: 138 mEq/L (ref 135–145)
Total Bilirubin: 1 mg/dL (ref 0.2–1.2)
Total Protein: 7.2 g/dL (ref 6.0–8.3)

## 2019-06-30 LAB — PSA: PSA: 0.31 ng/mL (ref 0.10–4.00)

## 2019-06-30 LAB — LIPID PANEL
Cholesterol: 199 mg/dL (ref 0–200)
HDL: 58.7 mg/dL (ref >39.00)
LDL Cholesterol: 128 mg/dL — ABNORMAL HIGH (ref 0–99)
NonHDL: 140.41
Total CHOL/HDL Ratio: 3
Triglycerides: 62 mg/dL (ref 0.0–149.0)
VLDL: 12.4 mg/dL (ref 0.0–40.0)

## 2019-06-30 LAB — HEMOGLOBIN A1C: Hgb A1c MFr Bld: 5.3 % (ref 4.6–6.5)

## 2019-06-30 NOTE — Progress Notes (Signed)
[name], MD Phone: [phone]  Benjamin Dyer is a 49 y.o. male who presents today for CPE.  Diet: Fairly healthy diet.  Eats lots of protein.  Does eat grilled foods and vegetables.  Does eat out a lot.  He is working on decreasing his portion sizes. Exercise: Exercises twice daily typically. Colonoscopy: Due Prostate cancer screening: Due Family history-  Prostate cancer: Father and paternal uncle  Colon cancer: No Vaccines-   Flu: Out of season  Tetanus: Due  COVID19: Patient notes he will likely get this in several months. Tobacco use: No Alcohol use: No Illicit Drug use: No Dentist: Due Ophthalmology: No, has noted some reading issues Patient does note he strained his back a week or so ago.  Notes its improved over the last 2 days.  He was using ibuprofen that that was not helpful and then he switched to Aleve.   Active Ambulatory Problems    Diagnosis Date Noted  . Encounter for general adult medical examination with abnormal findings 12/23/2015  . Asymmetrical hearing loss, right 03/06/2014  . Exertional dyspnea 06/30/2018  . Rib pain 06/30/2018  . Skin lesion 06/30/2018  . Back strain 06/30/2019   Resolved Ambulatory Problems    Diagnosis Date Noted  . No Resolved Ambulatory Problems   No Additional Past Medical History    Family History  Problem Relation Age of Onset  . Arthritis Mother   . Prostate cancer Father   . Mental illness Sister   . Diabetes Maternal Uncle   . Stroke Paternal Aunt     Social History   Socioeconomic History  . Marital status: Married    Spouse name: Not on file  . Number of children: Not on file  . Years of education: Not on file  . Highest education level: Not on file  Occupational History  . Not on file  Tobacco Use  . Smoking status: Never Smoker  . Smokeless tobacco: Never Used  Substance and Sexual Activity  . Alcohol use: No  . Drug use: No  . Sexual activity: Not on file  Other Topics Concern    . Not on file  Social History Narrative  . Not on file   Social Determinants of Health   Financial Resource Strain:   . Difficulty of Paying Living Expenses:   Food Insecurity:   . Worried About Charity fundraiser in the Last Year:   . Arboriculturist in the Last Year:   Transportation Needs:   . Film/video editor (Medical):   Marland Kitchen Lack of Transportation (Non-Medical):   Physical Activity:   . Days of Exercise per Week:   . Minutes of Exercise per Session:   Stress:   . Feeling of Stress :   Social Connections:   . Frequency of Communication with Friends and Family:   . Frequency of Social Gatherings with Friends and Family:   . Attends Religious Services:   . Active Member of Clubs or Organizations:   . Attends Archivist Meetings:   Marland Kitchen Marital Status:   Intimate Partner Violence:   . Fear of Current or Ex-Partner:   . Emotionally Abused:   Marland Kitchen Physically Abused:   . Sexually Abused:     ROS  General:  Negative for nexplained weight loss, fever Skin: Negative for new or changing mole, sore that won't heal HEENT: Positive for chronic tinnitus, negative for trouble hearing, trouble seeing, mouth sores, hoarseness, change in voice, dysphagia. CV:  Negative  for chest pain, dyspnea, edema, palpitations Resp: Negative for cough, dyspnea, hemoptysis GI: Negative for nausea, vomiting, diarrhea, constipation, abdominal pain, melena, hematochezia. GU: Negative for dysuria, incontinence, urinary hesitance, hematuria, vaginal or penile discharge, polyuria, sexual difficulty, lumps in testicle or breasts MSK: Negative for muscle cramps or aches, joint pain or swelling Neuro: Negative for headaches, weakness, numbness, dizziness, passing out/fainting Psych: Negative for depression, anxiety, memory problems  Objective  Physical Exam Vitals:   06/30/19 0810  BP: 120/70  Pulse: (!) 51  Temp: (!) 96.7 F (35.9 C)  SpO2: 97%    BP Readings from Last 3 Encounters:   06/30/19 120/70  06/28/18 124/70  06/25/17 108/70   Wt Readings from Last 3 Encounters:  06/30/19 264 lb 3.2 oz (119.8 kg)  06/28/18 271 lb (122.9 kg)  06/25/17 259 lb 12.8 oz (117.8 kg)    Physical Exam Constitutional:      General: He is not in acute distress.    Appearance: He is not diaphoretic.  HENT:     Head: Normocephalic and atraumatic.  Eyes:     Conjunctiva/sclera: Conjunctivae normal.     Pupils: Pupils are equal, round, and reactive to light.  Cardiovascular:     Rate and Rhythm: Normal rate and regular rhythm.     Heart sounds: Normal heart sounds.  Pulmonary:     Effort: Pulmonary effort is normal.     Breath sounds: Normal breath sounds.  Abdominal:     General: Abdomen is flat. Bowel sounds are normal. There is no distension.     Palpations: Abdomen is soft.     Tenderness: There is no abdominal tenderness. There is no guarding or rebound.  Musculoskeletal:     Comments: No midline spine tenderness, no midline spine step-off, no muscular back tenderness  Lymphadenopathy:     Cervical: No cervical adenopathy.  Skin:    General: Skin is warm and dry.  Neurological:     Mental Status: He is alert.     Comments: 5/5 strength bilateral quads, hamstrings, plantar flexion, and dorsiflexion, sensation light touch intact bilateral lower extremities      Assessment/Plan:   Encounter for general adult medical examination with abnormal findings Physical exam completed.  Encouraged continued exercise.  Discussed that he may want to decrease the frequency of his exercise as he could be overdoing it.  Discussed decreasing his portion sizes and decreasing how much he eats out.  Prostate cancer screening completed with PSA.  Referral to GI for colonoscopy.  Td given today.  He notes he is considering COVID-19 vaccine later this year.  Encouraged him to see a dentist.  Discussed seeing an optometrist for an eye exam.  Distance vision is normal.  Discussed he could try  reading glasses.  Lab work as outlined below.  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  Back strain Improved at this time.  He will monitor for any recurrence.  Discussed possibly decreasing his exercise routine as he is working out twice a day.   Orders Placed This Encounter  Procedures  . Comp Met (CMET)  . Lipid panel  . HgB A1c  . PSA  . Ambulatory referral to Gastroenterology    Referral Priority:   Routine    Referral Type:   Consultation    Referral Reason:   Specialty Services Required    Number of Visits Requested:   1    No orders of the defined types were placed in this encounter.   This  visit occurred during the SARS-CoV-2 public health emergency.  Safety protocols were in place, including screening questions prior to the visit, additional usage of staff PPE, and extensive cleaning of exam room while observing appropriate contact time as indicated for disinfecting solutions.    [name], MD [place]

## 2019-06-30 NOTE — Patient Instructions (Signed)
Nice to see you. Please try to work on your diet as we discussed. Please decrease your exercise on some days to take it a little easier. Please see an optometrist for your issues reading. Please see a dentist. We will contact you with your labs.

## 2019-06-30 NOTE — Assessment & Plan Note (Signed)
Improved at this time.  He will monitor for any recurrence.  Discussed possibly decreasing his exercise routine as he is working out twice a day.

## 2019-06-30 NOTE — Assessment & Plan Note (Signed)
Physical exam completed.  Encouraged continued exercise.  Discussed that he may want to decrease the frequency of his exercise as he could be overdoing it.  Discussed decreasing his portion sizes and decreasing how much he eats out.  Prostate cancer screening completed with PSA.  Referral to GI for colonoscopy.  Td given today.  He notes he is considering COVID-19 vaccine later this year.  Encouraged him to see a dentist.  Discussed seeing an optometrist for an eye exam.  Distance vision is normal.  Discussed he could try reading glasses.  Lab work as outlined below.  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

## 2019-07-10 ENCOUNTER — Other Ambulatory Visit (INDEPENDENT_AMBULATORY_CARE_PROVIDER_SITE_OTHER): Payer: Self-pay

## 2019-07-10 ENCOUNTER — Telehealth (INDEPENDENT_AMBULATORY_CARE_PROVIDER_SITE_OTHER): Payer: Self-pay | Admitting: Gastroenterology

## 2019-07-10 DIAGNOSIS — Z1211 Encounter for screening for malignant neoplasm of colon: Secondary | ICD-10-CM

## 2019-07-10 NOTE — Progress Notes (Signed)
Colonoscopy triage completed.  Patient will call his insurance before scheduling his colonoscopy to confirm coverage.  Pt states there is no family history of colon cancer with a first degree family member.  Instructions for colonoscopy will be sent still via mychart and patient has been asked to call me back if when he checks with the insurance they will cover and he wants to move forward with scheduling.  Gastroenterology Pre-Procedure Review  Request Date: PENDING Pt to call insurance Requesting Physician: (TBD)  PATIENT REVIEW QUESTIONS: The patient responded to the following health history questions as indicated:    1. Are you having any GI issues? no 2. Do you have a personal history of Polyps? no 3. Do you have a family history of Colon Cancer or Polyps? no 4. Diabetes Mellitus? no 5. Joint replacements in the past 12 months?no 6. Major health problems in the past 3 months?no 7. Any artificial heart valves, MVP, or defibrillator?no    MEDICATIONS & ALLERGIES:    Patient reports the following regarding taking any anticoagulation/antiplatelet therapy:   Plavix, Coumadin, Eliquis, Xarelto, Lovenox, Pradaxa, Brilinta, or Effient? no Aspirin? no  Patient confirms/reports the following medications:  Current Outpatient Medications  Medication Sig Dispense Refill   Multiple Vitamin (MULTIVITAMIN) tablet Take 1 tablet by mouth daily.     No current facility-administered medications for this visit.    Patient confirms/reports the following allergies:  No Known Allergies  No orders of the defined types were placed in this encounter.   AUTHORIZATION INFORMATION Primary Insurance: 1D#: Group #:  Secondary Insurance: 1D#: Group #:  SCHEDULE INFORMATION: Date: (TBD) Time: Location:[facility] or [facility]

## 2019-09-06 ENCOUNTER — Telehealth: Payer: Self-pay | Admitting: Family Medicine

## 2019-09-06 NOTE — Telephone Encounter (Signed)
"  Rejection Reason - Patient did not respond"  Gastroenterology said 6 days ago 

## 2019-09-08 NOTE — Telephone Encounter (Signed)
Please call the patient and let them know that GI has been trying to reach him to schedule colonoscopy.  Please encourage him to contact them to get this scheduled.  Thanks.

## 2019-09-12 NOTE — Telephone Encounter (Signed)
I called the patient and informed him that [facility] was trying to reach him and he needed to call and schedule his colonoscopy, patient understood.  Earleen Aoun,cma

## 2019-11-09 ENCOUNTER — Other Ambulatory Visit: Payer: Self-pay | Admitting: Physician Assistant

## 2019-11-12 LAB — CMP14+LP+CBC/D/PLT+PB+AS+HG
ALT: 29 IU/L (ref 0–44)
AST: 25 IU/L (ref 0–40)
Albumin/Globulin Ratio: 1.8 (ref 1.2–2.2)
Albumin: 4.5 g/dL (ref 4.0–5.0)
Alkaline Phosphatase: 69 IU/L (ref 44–121)
Arsenic: 2 ug/L (ref 2–23)
BUN/Creatinine Ratio: 16 (ref 9–20)
BUN: 18 mg/dL (ref 6–24)
Basophils Absolute: 0 10*3/uL (ref 0.0–0.2)
Basos: 0 %
Bilirubin Total: 1.1 mg/dL (ref 0.0–1.2)
CO2: 25 mmol/L (ref 20–29)
Calcium: 9.6 mg/dL (ref 8.7–10.2)
Chloride: 103 mmol/L (ref 96–106)
Cholesterol, Total: 229 mg/dL — ABNORMAL HIGH (ref 100–199)
Creatinine, Ser: 1.12 mg/dL (ref 0.76–1.27)
EOS (ABSOLUTE): 0.1 10*3/uL (ref 0.0–0.4)
Eos: 1 %
GFR calc Af Amer: 90 mL/min/{1.73_m2} (ref >59)
GFR calc non Af Amer: 78 mL/min/{1.73_m2} (ref >59)
Globulin, Total: 2.5 g/dL (ref 1.5–4.5)
Glucose: 100 mg/dL — ABNORMAL HIGH (ref 65–99)
HDL: 68 mg/dL (ref >39)
Hematocrit: 48.2 % (ref 37.5–51.0)
Hemoglobin: 16.5 g/dL (ref 13.0–17.7)
Immature Grans (Abs): 0 10*3/uL (ref 0.0–0.1)
Immature Granulocytes: 0 %
LDL Chol Calc (NIH): 153 mg/dL — ABNORMAL HIGH (ref 0–99)
Lead, Blood: <1 ug/dL (ref 0–4)
Lymphocytes Absolute: 2 10*3/uL (ref 0.7–3.1)
Lymphs: 39 %
MCH: 31.3 pg (ref 26.6–33.0)
MCHC: 34.2 g/dL (ref 31.5–35.7)
MCV: 91 fL (ref 79–97)
Mercury: 5 ug/L (ref 0.0–14.9)
Monocytes Absolute: 0.5 10*3/uL (ref 0.1–0.9)
Monocytes: 9 %
Neutrophils Absolute: 2.6 10*3/uL (ref 1.4–7.0)
Neutrophils: 51 %
Platelets: 189 10*3/uL (ref 150–450)
Potassium: 4.9 mmol/L (ref 3.5–5.2)
RBC: 5.28 x10E6/uL (ref 4.14–5.80)
RDW: 12.6 % (ref 11.6–15.4)
Sodium: 139 mmol/L (ref 134–144)
Total Protein: 7 g/dL (ref 6.0–8.5)
Triglycerides: 50 mg/dL (ref 0–149)
VLDL Cholesterol Cal: 8 mg/dL (ref 5–40)
WBC: 5.1 10*3/uL (ref 3.4–10.8)

## 2019-11-12 LAB — QUANTIFERON-TB GOLD PLUS
QuantiFERON Mitogen Value: >10 IU/mL
QuantiFERON Nil Value: 0.06 IU/mL
QuantiFERON TB1 Ag Value: 0.16 IU/mL
QuantiFERON TB2 Ag Value: 0.2 IU/mL
QuantiFERON-TB Gold Plus: NEGATIVE

## 2020-07-08 ENCOUNTER — Encounter: Payer: PRIVATE HEALTH INSURANCE | Admitting: Family Medicine

## 2020-10-26 DIAGNOSIS — J0100 Acute maxillary sinusitis, unspecified: Secondary | ICD-10-CM | POA: Diagnosis not present

## 2020-11-08 ENCOUNTER — Ambulatory Visit
Admission: RE | Admit: 2020-11-08 | Discharge: 2020-11-08 | Disposition: A | Discharge status: Home / Self Care | Payer: Self-pay | Source: Ambulatory Visit | Attending: Occupational Medicine | Admitting: Occupational Medicine

## 2020-11-08 ENCOUNTER — Other Ambulatory Visit: Payer: Self-pay

## 2020-11-08 ENCOUNTER — Other Ambulatory Visit: Payer: Self-pay | Admitting: Occupational Medicine

## 2020-11-08 DIAGNOSIS — Z0000 Encounter for general adult medical examination without abnormal findings: Secondary | ICD-10-CM

## 2021-03-24 ENCOUNTER — Other Ambulatory Visit: Payer: Self-pay

## 2021-03-24 ENCOUNTER — Emergency Department: Payer: BC Managed Care – PPO

## 2021-03-24 DIAGNOSIS — R0781 Pleurodynia: Secondary | ICD-10-CM | POA: Diagnosis not present

## 2021-03-24 DIAGNOSIS — Z8261 Family history of arthritis: Secondary | ICD-10-CM

## 2021-03-24 DIAGNOSIS — Z8042 Family history of malignant neoplasm of prostate: Secondary | ICD-10-CM

## 2021-03-24 DIAGNOSIS — Z20822 Contact with and (suspected) exposure to covid-19: Secondary | ICD-10-CM | POA: Diagnosis not present

## 2021-03-24 DIAGNOSIS — Z833 Family history of diabetes mellitus: Secondary | ICD-10-CM | POA: Diagnosis not present

## 2021-03-24 DIAGNOSIS — I82461 Acute embolism and thrombosis of right calf muscular vein: Secondary | ICD-10-CM | POA: Diagnosis not present

## 2021-03-24 DIAGNOSIS — Z823 Family history of stroke: Secondary | ICD-10-CM

## 2021-03-24 DIAGNOSIS — R0902 Hypoxemia: Secondary | ICD-10-CM | POA: Diagnosis present

## 2021-03-24 DIAGNOSIS — J90 Pleural effusion, not elsewhere classified: Secondary | ICD-10-CM | POA: Diagnosis not present

## 2021-03-24 DIAGNOSIS — I2699 Other pulmonary embolism without acute cor pulmonale: Secondary | ICD-10-CM | POA: Diagnosis not present

## 2021-03-24 DIAGNOSIS — R1011 Right upper quadrant pain: Secondary | ICD-10-CM | POA: Diagnosis not present

## 2021-03-24 DIAGNOSIS — I2609 Other pulmonary embolism with acute cor pulmonale: Principal | ICD-10-CM | POA: Diagnosis present

## 2021-03-24 DIAGNOSIS — I82431 Acute embolism and thrombosis of right popliteal vein: Secondary | ICD-10-CM | POA: Diagnosis present

## 2021-03-24 DIAGNOSIS — I82811 Embolism and thrombosis of superficial veins of right lower extremities: Secondary | ICD-10-CM | POA: Diagnosis not present

## 2021-03-24 DIAGNOSIS — R0789 Other chest pain: Secondary | ICD-10-CM | POA: Diagnosis not present

## 2021-03-24 DIAGNOSIS — R109 Unspecified abdominal pain: Secondary | ICD-10-CM | POA: Diagnosis not present

## 2021-03-24 DIAGNOSIS — I517 Cardiomegaly: Secondary | ICD-10-CM | POA: Diagnosis not present

## 2021-03-24 LAB — CBC WITH DIFFERENTIAL/PLATELET
Abs Immature Granulocytes: 0.04 10*3/uL (ref 0.00–0.07)
Basophils Absolute: 0 10*3/uL (ref 0.0–0.1)
Basophils Relative: 0 %
Eosinophils Absolute: 0.1 10*3/uL (ref 0.0–0.5)
Eosinophils Relative: 1 %
HCT: 47.3 % (ref 39.0–52.0)
Hemoglobin: 16.3 g/dL (ref 13.0–17.0)
Immature Granulocytes: 0 %
Lymphocytes Relative: 23 %
Lymphs Abs: 2.4 10*3/uL (ref 0.7–4.0)
MCH: 30.2 pg (ref 26.0–34.0)
MCHC: 34.5 g/dL (ref 30.0–36.0)
MCV: 87.6 fL (ref 80.0–100.0)
Monocytes Absolute: 1.2 10*3/uL — ABNORMAL HIGH (ref 0.1–1.0)
Monocytes Relative: 12 %
Neutro Abs: 6.5 10*3/uL (ref 1.7–7.7)
Neutrophils Relative %: 64 %
Platelets: 230 10*3/uL (ref 150–400)
RBC: 5.4 MIL/uL (ref 4.22–5.81)
RDW: 12.2 % (ref 11.5–15.5)
WBC: 10.2 10*3/uL (ref 4.0–10.5)
nRBC: 0 % (ref 0.0–0.2)

## 2021-03-24 LAB — COMPREHENSIVE METABOLIC PANEL
ALT: 28 U/L (ref 0–44)
AST: 21 U/L (ref 15–41)
Albumin: 4.3 g/dL (ref 3.5–5.0)
Alkaline Phosphatase: 78 U/L (ref 38–126)
Anion gap: 12 (ref 5–15)
BUN: 18 mg/dL (ref 6–20)
CO2: 27 mmol/L (ref 22–32)
Calcium: 9.5 mg/dL (ref 8.9–10.3)
Chloride: 100 mmol/L (ref 98–111)
Creatinine, Ser: 1.08 mg/dL (ref 0.61–1.24)
GFR, Estimated: >60 mL/min (ref >60)
Glucose, Bld: 109 mg/dL — ABNORMAL HIGH (ref 70–99)
Potassium: 3.7 mmol/L (ref 3.5–5.1)
Sodium: 139 mmol/L (ref 135–145)
Total Bilirubin: 1.1 mg/dL (ref 0.3–1.2)
Total Protein: 7.7 g/dL (ref 6.5–8.1)

## 2021-03-24 LAB — LIPASE, BLOOD: Lipase: 31 U/L (ref 11–51)

## 2021-03-24 LAB — TROPONIN I (HIGH SENSITIVITY): Troponin I (High Sensitivity): 6 ng/L (ref <18)

## 2021-03-24 NOTE — ED Triage Notes (Signed)
Pt to ED from home c/o right upper abd pain into right ribs and right shoulder.  States pain is sharp, SOB, denies n/v/d, denies fevers.  Pain with palpation to RUQ.  Pt A&Ox4, chest rise even and unlabored, skin WNL and in NAD at this time.  Roderic Palau, [place] in triage for MSE.

## 2021-03-24 NOTE — ED Notes (Signed)
Patient transported to X-ray 

## 2021-03-24 NOTE — ED Provider Triage Note (Signed)
Emergency Medicine Provider Triage Evaluation Note  Benjamin Dyer , a 51 y.o. male  was evaluated in triage.  Pt complains of right upper quadrant abdominal pain rating into his right shoulder.  Patient presents with 24 hours of pain described as his right upper quadrant radiating into the shoulder.  No substernal pain, no shortness of breath.  No fevers or chills, no nausea or vomiting.  Patient states that when he takes in a deep breath the pain in his abdomen causes him to not be able to take that breath and fully but there is no pleuritic pain..  Review of Systems  Positive: Right upper quadrant abdominal pain radiating into the right shoulder Negative: Substernal chest pain, cough, shortness of breath, emesis, diarrhea, constipation, urinary changes, URI symptoms.  Physical Exam  BP 137/89 (BP Location: Right Arm)    Pulse 64    Temp 98.6 F (37 C) (Oral)    Resp 16    Ht 6\' 4"  (1.93 m)    Wt 127 kg    SpO2 97%    BMI 34.08 kg/m  Gen:   Awake, no distress   Resp:  Normal effort  MSK:   Moves extremities without difficulty  Other:  Bowel sounds present x4 quadrants.  No significant tenderness on physical exam.  Medical Decision Making  Medically screening exam initiated at 10:56 PM.  Appropriate orders placed.  Jose Pacey was informed that the remainder of the evaluation will be completed by another provider, this initial triage assessment does not replace that evaluation, and the importance of remaining in the ED until their evaluation is complete.  Patient presents with right upper quadrant abdominal pain radiating into the right shoulder.  At this time patient will have EKG, chest x-ray, right upper quadrant ultrasound, labs and urinalysis.   Darletta Moll, PA-C 03/24/21 2256

## 2021-03-25 ENCOUNTER — Observation Stay: Admit: 2021-03-25 | Payer: BC Managed Care – PPO

## 2021-03-25 ENCOUNTER — Observation Stay (HOSPITAL_BASED_OUTPATIENT_CLINIC_OR_DEPARTMENT_OTHER)
Admit: 2021-03-25 | Discharge: 2021-03-25 | Disposition: A | Discharge status: Home / Self Care | Payer: BC Managed Care – PPO | Attending: Internal Medicine | Admitting: Internal Medicine

## 2021-03-25 ENCOUNTER — Other Ambulatory Visit (INDEPENDENT_AMBULATORY_CARE_PROVIDER_SITE_OTHER): Payer: Self-pay | Admitting: Nurse Practitioner

## 2021-03-25 ENCOUNTER — Inpatient Hospital Stay
Admission: EM | Admit: 2021-03-25 | Discharge: 2021-03-27 | DRG: 164 | Disposition: A | Discharge status: Home / Self Care | Payer: BC Managed Care – PPO | Attending: Internal Medicine | Admitting: Internal Medicine

## 2021-03-25 ENCOUNTER — Emergency Department: Payer: BC Managed Care – PPO

## 2021-03-25 ENCOUNTER — Encounter: Payer: Self-pay | Admitting: Internal Medicine

## 2021-03-25 DIAGNOSIS — I2609 Other pulmonary embolism with acute cor pulmonale: Secondary | ICD-10-CM | POA: Diagnosis not present

## 2021-03-25 DIAGNOSIS — R1011 Right upper quadrant pain: Secondary | ICD-10-CM

## 2021-03-25 DIAGNOSIS — I2699 Other pulmonary embolism without acute cor pulmonale: Secondary | ICD-10-CM | POA: Diagnosis present

## 2021-03-25 DIAGNOSIS — R079 Chest pain, unspecified: Secondary | ICD-10-CM

## 2021-03-25 DIAGNOSIS — I82431 Acute embolism and thrombosis of right popliteal vein: Secondary | ICD-10-CM

## 2021-03-25 LAB — ECHOCARDIOGRAM COMPLETE
AR max vel: 2.47 cm2
AV Area VTI: 2.68 cm2
AV Area mean vel: 2.39 cm2
AV Mean grad: 4.5 mmHg
AV Peak grad: 7.8 mmHg
Ao pk vel: 1.4 m/s
Area-P 1/2: 2.99 cm2
MV VTI: 2.81 cm2
S' Lateral: 3 cm

## 2021-03-25 LAB — LACTIC ACID, PLASMA
Lactic Acid, Venous: 0.9 mmol/L (ref 0.5–1.9)
Lactic Acid, Venous: 1.1 mmol/L (ref 0.5–1.9)

## 2021-03-25 LAB — HEPARIN LEVEL (UNFRACTIONATED)
Heparin Unfractionated: 0.5 IU/mL (ref 0.30–0.70)
Heparin Unfractionated: 0.43 IU/mL (ref 0.30–0.70)

## 2021-03-25 LAB — PROTIME-INR
INR: 1.1 (ref 0.8–1.2)
Prothrombin Time: 14.1 seconds (ref 11.4–15.2)

## 2021-03-25 LAB — APTT: aPTT: 28 seconds (ref 24–36)

## 2021-03-25 LAB — ANTITHROMBIN III: AntiThromb III Func: 102 % (ref 75–120)

## 2021-03-25 LAB — TROPONIN I (HIGH SENSITIVITY): Troponin I (High Sensitivity): 7 ng/L (ref <18)

## 2021-03-25 LAB — CK: Total CK: 80 U/L (ref 49–397)

## 2021-03-25 LAB — RESP PANEL BY RT-PCR (FLU A&B, COVID) ARPGX2
Influenza A by PCR: NEGATIVE
Influenza B by PCR: NEGATIVE
SARS Coronavirus 2 by RT PCR: NEGATIVE

## 2021-03-25 MED ORDER — OXYCODONE HCL 5 MG PO TABS: 5.0000 mg | ORAL_TABLET | ORAL | Status: DC | PRN

## 2021-03-25 MED ORDER — ACETAMINOPHEN 650 MG RE SUPP: 650.0000 mg | Freq: Four times a day (QID) | RECTAL | Status: DC | PRN

## 2021-03-25 MED ORDER — ACETAMINOPHEN 325 MG PO TABS
650.0000 mg | ORAL_TABLET | Freq: Four times a day (QID) | ORAL | Status: DC | PRN
  Administered 2021-03-25: 650 mg via ORAL
  Filled 2021-03-25: qty 2

## 2021-03-25 MED ORDER — HEPARIN (PORCINE) 25000 UT/250ML-% IV SOLN
1850.0000 [IU]/h | INTRAVENOUS | Status: DC
Start: 2021-03-25 — End: 2021-03-27
  Administered 2021-03-25 – 2021-03-27 (×4): 1850 [IU]/h via INTRAVENOUS
  Filled 2021-03-25 (×4): qty 250

## 2021-03-25 MED ORDER — ONDANSETRON HCL 4 MG/2ML IJ SOLN: 4.0000 mg | Freq: Four times a day (QID) | INTRAMUSCULAR | Status: DC | PRN

## 2021-03-25 MED ORDER — HEPARIN SODIUM (PORCINE) 5000 UNIT/ML IJ SOLN
4000.0000 [IU] | Freq: Once | INTRAMUSCULAR | Status: DC
Start: 2021-03-25 — End: 2021-03-25

## 2021-03-25 MED ORDER — HEPARIN BOLUS VIA INFUSION
6800.0000 [IU] | Freq: Once | INTRAVENOUS | Status: AC
  Administered 2021-03-25: 6800 [IU] via INTRAVENOUS
  Filled 2021-03-25: qty 6800

## 2021-03-25 MED ORDER — HEPARIN (PORCINE) 25000 UT/250ML-% IV SOLN
14.0000 [IU]/kg/h | INTRAVENOUS | Status: DC
Start: 2021-03-25 — End: 2021-03-25

## 2021-03-25 MED ORDER — IOHEXOL 350 MG/ML SOLN
100.0000 mL | Freq: Once | INTRAVENOUS | Status: AC | PRN
  Administered 2021-03-25: 100 mL via INTRAVENOUS

## 2021-03-25 MED ORDER — ONDANSETRON HCL 4 MG PO TABS: 4.0000 mg | ORAL_TABLET | Freq: Four times a day (QID) | ORAL | Status: DC | PRN

## 2021-03-25 MED ORDER — SODIUM CHLORIDE 0.9 % IV BOLUS
1000.0000 mL | Freq: Once | INTRAVENOUS | Status: AC
  Administered 2021-03-25: 1000 mL via INTRAVENOUS

## 2021-03-25 MED ORDER — KETOROLAC TROMETHAMINE 30 MG/ML IJ SOLN
15.0000 mg | Freq: Once | INTRAMUSCULAR | Status: AC
  Administered 2021-03-25: 15 mg via INTRAVENOUS
  Filled 2021-03-25: qty 1

## 2021-03-25 MED ORDER — POLYETHYLENE GLYCOL 3350 17 G PO PACK: 17.0000 g | PACK | Freq: Every day | ORAL | Status: DC | PRN

## 2021-03-25 NOTE — H&P (View-Only) (Signed)
°[facility] °Vascular Consult Note ° °MRN : [id number] ° °Benjamin Dyer is a 50 y.o. (11/16/1970) male who presents with chief complaint of  °Chief Complaint  °Patient presents with  ° Abdominal Pain  ° Shoulder Pain  °. ° °History of Present Illness: We are asked to consult by Dr. Patel in regards to a pulmonary embolism and DVT.  Benjamin Dyer is a 50-year-old male with no significant medical history that presented to the emergency room today due to having right lower chest pain and shoulder pain that have been persistent for the day.  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  The patient reports traveling fairly frequently for their daughter's sports with most recent travel to [place]. ° °In the midst of work-up the patient had a lower extremity ultrasound that showed a nonocclusive popliteal DVT in the right lower extremity as well as in the gastrocnemius.  Patient also had a CT scan which revealed bilateral pulmonary embolus with concern of mild right heart strain. ° °Current Facility-Administered Medications  °Medication Dose Route Frequency Provider Last Rate Last Admin  ° acetaminophen (TYLENOL) tablet 650 mg  650 mg Oral Q6H PRN Patel, Sona, MD      ° Or  ° acetaminophen (TYLENOL) suppository 650 mg  650 mg Rectal Q6H PRN Patel, Sona, MD      ° heparin ADULT infusion 100 units/mL (25000 units/250mL)  1,850 Units/hr Intravenous Continuous Belue, Nathan S, RPH 18.5 mL/hr at 03/25/21 1729 1,850 Units/hr at 03/25/21 1729  ° ondansetron (ZOFRAN) tablet 4 mg  4 mg Oral Q6H PRN Patel, Sona, MD      ° Or  ° ondansetron (ZOFRAN) injection 4 mg  4 mg Intravenous Q6H PRN Patel, Sona, MD      ° oxyCODONE (Oxy IR/ROXICODONE) immediate release tablet 5 mg  5 mg Oral Q4H PRN Patel, Sona, MD      ° polyethylene glycol (MIRALAX / GLYCOLAX) packet 17 g  17 g Oral Daily PRN Patel, Sona, MD      ° ° °History reviewed. No pertinent past medical history. ° °Past  Surgical History:  °Procedure Laterality Date  ° NO PAST SURGERIES    ° ° °Social History °Social History  ° °Tobacco Use  ° Smoking status: Never  ° Smokeless tobacco: Never  °Vaping Use  ° Vaping Use: Never used  °Substance Use Topics  ° Alcohol use: No  ° Drug use: No  ° ° °Family History °Family History  °Problem Relation Age of Onset  ° Arthritis Mother   ° Prostate cancer Father   ° Mental illness Sister   ° Diabetes Maternal Uncle   ° Stroke Paternal Aunt   ° ° °No Known Allergies ° ° °REVIEW OF SYSTEMS (Negative unless checked) ° °Constitutional: []Weight loss  []Fever  []Chills °Cardiac: [x]Chest pain   []Chest pressure   []Palpitations   []Shortness of breath when laying flat   []Shortness of breath at rest   []Shortness of breath with exertion. °Vascular:  []Pain in legs with walking   []Pain in legs at rest   []Pain in legs when laying flat   []Claudication   []Pain in feet when walking  []Pain in feet at rest  []Pain in feet when laying flat   []History of DVT   []Phlebitis   []Swelling in legs   []Varicose veins   []Non-healing ulcers °Pulmonary:   []Uses home oxygen   []Productive cough   []  Hemoptysis   []Wheeze  []COPD   []Asthma °Neurologic:  []Dizziness  []Blackouts   []Seizures   []History of stroke   []History of TIA  []Aphasia   []Temporary blindness   []Dysphagia   []Weakness or numbness in arms   []Weakness or numbness in legs °Musculoskeletal:  []Arthritis   []Joint swelling   []Joint pain   []Low back pain °Hematologic:  []Easy bruising  []Easy bleeding   []Hypercoagulable state   []Anemic  []Hepatitis °Gastrointestinal:  []Blood in stool   []Vomiting blood  []Gastroesophageal reflux/heartburn   []Difficulty swallowing. °Genitourinary:  []Chronic kidney disease   []Difficult urination  []Frequent urination  []Burning with urination   []Blood in urine °Skin:  []Rashes   []Ulcers   []Wounds °Psychological:  []History of anxiety   [] History of major depression. ° °Physical  Examination ° °Vitals:  ° 03/25/21 1030 03/25/21 1100 03/25/21 1825 03/25/21 1947  °BP: 127/68 118/62 124/73 126/82  °Pulse: (!) 50 (!) 57 62 62  °Resp: 20 17 16 15  °Temp:   99.1 °F (37.3 °C) 98.2 °F (36.8 °C)  °TempSrc:      °SpO2: 98% 95% 98% 98%  °Weight:      °Height:      ° °Body mass index is 34.08 kg/m². °Gen:  WD/WN, NAD °Head: [place]/AT, No temporalis wasting. Prominent temp pulse not noted. °Ear/Nose/Throat: Hearing grossly intact, nares w/o erythema or drainage, oropharynx w/o Erythema/Exudate °Eyes: Sclera non-icteric, conjunctiva clear °Neck: Trachea midline.  No JVD.  °Pulmonary:  Good air movement, some shortness of breath noted ° °Musculoskeletal: M/S 5/5 throughout.  Extremities without ischemic changes.  No deformity or atrophy. No edema. °Neurologic: Sensation grossly intact in extremities.  Symmetrical.  Speech is fluent. Motor exam as listed above. °Psychiatric: Judgment intact, Mood & affect appropriate for pt's clinical situation. °Dermatologic: No rashes or ulcers noted.  No cellulitis or open wounds. ° ° ° ° ° °CBC °Lab Results  °Component Value Date  ° WBC 10.2 03/24/2021  ° HGB 16.3 03/24/2021  ° HCT 47.3 03/24/2021  ° MCV 87.6 03/24/2021  ° PLT 230 03/24/2021  ° ° °BMET °   °Component Value Date/Time  ° NA 139 03/24/2021 2304  ° NA 139 11/09/2019 0845  ° K 3.7 03/24/2021 2304  ° CL 100 03/24/2021 2304  ° CO2 27 03/24/2021 2304  ° GLUCOSE 109 (H) 03/24/2021 2304  ° BUN 18 03/24/2021 2304  ° BUN 18 11/09/2019 0845  ° CREATININE 1.08 03/24/2021 2304  ° CALCIUM 9.5 03/24/2021 2304  ° GFRNONAA >60 03/24/2021 2304  ° GFRAA 90 11/09/2019 0845  ° °Estimated Creatinine Clearance: 121.7 mL/min (by C-G formula based on SCr of 1.08 mg/dL). ° °COAG °Lab Results  °Component Value Date  ° INR 1.1 03/25/2021  ° ° °Radiology °DG Chest 2 View ° °Result Date: 03/24/2021 °CLINICAL DATA:  Right-sided upper abdominal pain and right-sided rib pain. EXAM: CHEST - 2 VIEW COMPARISON:  November 08, 2020 FINDINGS: Mildly  low lung volumes are present. Mild right basilar atelectasis and/or infiltrate is seen. A very small right pleural effusion is noted. No pneumothorax is identified. The heart size and mediastinal contours are within normal limits. The visualized skeletal structures are unremarkable. IMPRESSION: 1. Mild right basilar atelectasis and/or infiltrate. 2. Very small right pleural effusion. Electronically Signed   By: Thaddeus  Houston M.D.   On: 03/24/2021 23:17  ° °CT Angio Chest PE W/Cm &/Or Wo Cm ° °Result Date: 03/25/2021 °CLINICAL DATA:  Right chest pain. EXAM:   CT ANGIOGRAPHY CHEST WITH CONTRAST TECHNIQUE: Multidetector CT imaging of the chest was performed using the standard protocol during bolus administration of intravenous contrast. Multiplanar CT image reconstructions and MIPs were obtained to evaluate the vascular anatomy. RADIATION DOSE REDUCTION: This exam was performed according to the departmental dose-optimization program which includes automated exposure control, adjustment of the mA and/or kV according to patient size and/or use of iterative reconstruction technique. CONTRAST:  100mL OMNIPAQUE IOHEXOL 350 MG/ML SOLN COMPARISON:  PA and lateral chest yesterday.  No prior chest CT. FINDINGS: Cardiovascular: There is mild cardiomegaly with a slight right chamber predominance, mild degree of IVC reflux. Pulmonary arteries are normal in caliber. There are embolic filling defects in multiple right lower lobe basal subsegmental arteries and in least 2 subsegmental left lower lobe posterior basal arteries and possibly a few downstream small divisions of these arteries. There is no pericardial effusion , no visible coronary artery calcification. There are trace calcifications in the aortic annulus. The aorta is within normal caliber limits with normal great vessels. The pulmonary veins are normal caliber. There is a prominent azygous venous confluence incidentally seen. Mediastinum/Nodes: There is a slightly  prominent lymph node in the inferior right hilum measuring 1 cm in short axis. There are no further intrathoracic adenopathy. Visualized thyroid gland, axillary spaces are unremarkable. The thoracic esophagus is unremarkable. The trachea is free of filling defects. Lungs/Pleura: There is a small layering right and trace left pleural effusions. There is patchy consolidation mixed with linear opacities in the right lower lobe, which could be due to pulmonary infarcts, pneumonic infiltrates or combination. There are linear atelectatic foci in the right middle lobe and base of the right upper lobe, and in the left lower lobe in the setting of low lung volumes. The remaining lungs clear.  Central airways are clear. Upper Abdomen: No acute abnormality. Musculoskeletal: There is chronic wedging and endplate irregularities in the lower half of the thoracic spine with degenerative disc disease and scattered endplate Schmorl's nodes. Findings could be due to old trauma or childhood Scheuermann's disease. Review of the MIP images confirms the above findings. IMPRESSION: 1. Right-greater-than-left bilateral lower lobe subsegmental arterial emboli. 2. Overall small clot burden but with a least mild findings of right heart strain noted. 3. Small right pleural effusion, with opacities in the right lower lobe consistent with pneumonic infiltrates and/or infarcts. 4. Single slightly prominent right hilar lymph node possibly reactive. 5. Follow-up study recommended after treatment. 6. Results phoned to Dr. Sung at 5:58 a.m., 03/25/2021. Electronically Signed   By: Keith  Chesser M.D.   On: 03/25/2021 06:05  ° °US Venous Img Lower Unilateral Right (DVT) ° °Result Date: 03/25/2021 °CLINICAL DATA:  50-year-old male with history of right lower extremity pain. EXAM: RIGHT LOWER EXTREMITY VENOUS DOPPLER ULTRASOUND TECHNIQUE: Gray-scale sonography with compression, as well as color and duplex ultrasound, were performed to evaluate the deep  venous system(s) from the level of the common femoral vein through the popliteal and proximal calf veins. COMPARISON:  No priors. FINDINGS: VENOUS Normal compressibility of the common femoral, and superficial femoral veins, as well as the visualized deep calf veins. However, there was some echogenic material within the popliteal vein which is compatible with thrombus, which appears nonocclusive at this time. There also appears to be some superficial thrombus in a gastrocnemius vein in the right calf region. Visualized portions of profunda femoral vein and great saphenous vein unremarkable. No other filling defects to suggest DVT on grayscale or color Doppler imaging.   Doppler waveforms show normal direction of venous flow, normal respiratory plasticity and response to augmentation. Limited views of the contralateral common femoral vein are unremarkable. OTHER None. Limitations: none IMPRESSION: 1. Study is positive for nonocclusive deep venous thrombosis in the right popliteal vein. Additional thrombus is noted in some superficial veins in the region of the right gastrocnemius. Negative. Electronically Signed   By: Daniel  Entrikin M.D.   On: 03/25/2021 06:39  ° °ECHOCARDIOGRAM COMPLETE ° °Result Date: 03/25/2021 °   ECHOCARDIOGRAM REPORT   Patient Name:   Benjamin Dyer Date of Exam: 03/25/2021 Medical Rec #:  [id number]           Height:       76.0 in Accession #:    [id number]          Weight:       280.0 lb Date of Birth:  07/04/1970           BSA:          2.556 m² Patient Age:    50 years            BP:           118/62 mmHg Patient Gender: M                   HR:           57 bpm. Exam Location:  [facility] Procedure: 2D Echo, Cardiac Doppler, Color Doppler and Saline Contrast Bubble            Study Indications:     Pulmonary Embolus I26.09  History:         Patient has no prior history of Echocardiogram examinations. No                  past medical history on file.  Sonographer:     Jerry Hege Referring Phys:   [id number] SONA PATEL Diagnosing Phys: Christopher End MD IMPRESSIONS  1. Left ventricular ejection fraction, by estimation, is 55 to 60%. The left ventricle has normal function. Left ventricular endocardial border not optimally defined to evaluate regional wall motion. Left ventricular diastolic parameters were normal.  2. Right ventricular systolic function is normal. The right ventricular size is normal.  3. Left atrial size was mild to moderately dilated.  4. Right atrial size was mild to moderately dilated.  5. The mitral valve is grossly normal. Trivial mitral valve regurgitation. No evidence of mitral stenosis.  6. The aortic valve has an indeterminant number of cusps. Aortic valve regurgitation is not visualized. No aortic stenosis is present.  7. Agitated saline contrast bubble study was negative, with no evidence of any interatrial shunt. FINDINGS  Left Ventricle: Left ventricular ejection fraction, by estimation, is 55 to 60%. The left ventricle has normal function. Left ventricular endocardial border not optimally defined to evaluate regional wall motion. The left ventricular internal cavity size was normal in size. There is no left ventricular hypertrophy. Left ventricular diastolic parameters were normal. Right Ventricle: The right ventricular size is normal. No increase in right ventricular wall thickness. Right ventricular systolic function is normal. Left Atrium: Left atrial size was mild to moderately dilated. Right Atrium: Right atrial size was mild to moderately dilated. Pericardium: There is no evidence of pericardial effusion. Mitral Valve: The mitral valve is grossly normal. Trivial mitral valve regurgitation. No evidence of mitral valve stenosis. MV peak gradient, 3.1 mmHg. The mean mitral valve gradient is 1.0 mmHg. Tricuspid Valve: The tricuspid   valve is normal in structure. Tricuspid valve regurgitation is trivial. Aortic Valve: The aortic valve has an indeterminant number of cusps. Aortic valve  regurgitation is not visualized. No aortic stenosis is present. Aortic valve mean gradient measures 4.5 mmHg. Aortic valve peak gradient measures 7.8 mmHg. Aortic valve area, by VTI measures 2.68 cm². Pulmonic Valve: The pulmonic valve was not well visualized. Pulmonic valve regurgitation is trivial. No evidence of pulmonic stenosis. Aorta: The aortic root is normal in size and structure. Pulmonary Artery: The pulmonary artery is not well seen. Venous: The inferior vena cava was not well visualized. IAS/Shunts: The interatrial septum was not well visualized. Agitated saline contrast was given intravenously to evaluate for intracardiac shunting. Agitated saline contrast bubble study was negative, with no evidence of any interatrial shunt.  LEFT VENTRICLE PLAX 2D LVIDd:         4.50 cm   Diastology LVIDs:         3.00 cm   LV e' medial:    9.46 cm/s LV PW:         1.05 cm   LV E/e' medial:  7.3 LV IVS:        0.93 cm   LV e' lateral:   7.18 cm/s LVOT diam:     2.00 cm   LV E/e' lateral: 9.7 LV SV:         76 LV SV Index:   30 LVOT Area:     3.14 cm²  RIGHT VENTRICLE RV Basal diam:  3.10 cm RV S prime:     14.90 cm/s TAPSE (M-mode): 3.9 cm LEFT ATRIUM              Index        RIGHT ATRIUM           Index LA diam:        3.80 cm  1.49 cm/m²   RA Area:     26.80 cm² LA Vol (A2C):   71.4 ml  27.94 ml/m²  RA Volume:   84.70 ml  33.14 ml/m² LA Vol (A4C):   106.0 ml 41.47 ml/m² LA Biplane Vol: 90.6 ml  35.45 ml/m²  AORTIC VALVE                    PULMONIC VALVE AV Area (Vmax):    2.47 cm²     PV Vmax:        0.91 m/s AV Area (Vmean):   2.39 cm²     PV Vmean:       60.500 cm/s AV Area (VTI):     2.68 cm²     PV VTI:         0.201 m AV Vmax:           139.50 cm/s  PV Peak grad:   3.3 mmHg AV Vmean:          98.000 cm/s  PV Mean grad:   2.0 mmHg AV VTI:            0.285 m      RVOT Peak grad: 7 mmHg AV Peak Grad:      7.8 mmHg AV Mean Grad:      4.5 mmHg LVOT Vmax:         109.50 cm/s LVOT Vmean:        74.650 cm/s LVOT VTI:           0.242 m LVOT/AV VTI ratio: 0.85  AORTA Ao Root diam: 3.03   cm MITRAL VALVE MV Area (PHT): 2.99 cm²    SHUNTS MV Area VTI:   2.81 cm²    Systemic VTI:  0.24 m MV Peak grad:  3.1 mmHg    Systemic Diam: 2.00 cm MV Mean grad:  1.0 mmHg    Pulmonic VTI:  0.216 m MV Vmax:       0.88 m/s MV Vmean:      42.0 cm/s MV Decel Time: 254 msec MV E velocity: 69.40 cm/s MV A velocity: 58.70 cm/s MV E/A ratio:  1.18 Christopher End MD Electronically signed by Christopher End MD Signature Date/Time: 03/25/2021/7:35:24 PM    Final   ° °US Abdomen Limited RUQ (LIVER/GB) ° °Result Date: 03/25/2021 °CLINICAL DATA:  Right upper quadrant pain. EXAM: ULTRASOUND ABDOMEN LIMITED RIGHT UPPER QUADRANT COMPARISON:  None. FINDINGS: Gallbladder: The gallbladder is mildly contracted. No gallstones or wall thickening visualized (2.8 mm). No sonographic Murphy sign noted by sonographer. Common bile duct: Diameter: 3.3 mm Liver: No focal lesion identified. Within normal limits in parenchymal echogenicity. Portal vein is patent on color Doppler imaging with normal direction of blood flow towards the liver. Other: A trace amount of pleural fluid is suspected on the right. IMPRESSION: 1. Trace right pleural effusion. 2. Otherwise, unremarkable right upper quadrant ultrasound. Electronically Signed   By: Thaddeus  Houston M.D.   On: 03/25/2021 00:32   ° ° ° °Assessment/Plan °Pulmonary Embolism °-Patient is acutely symptomatic with evidence of right heart strain, based on his age and symptoms  pulmonary thrombectomy would benefit the patient and quick resolution of symptoms as well as helping him to heal in the long-term.  We discussed the risk, benefits and alternatives the patient agrees to proceed.  We will plan on intervening tomorrow. °-The patient's pulmonary embolism was likely provoked.  Recommend anticoagulation for minimum of at least 1 year ° °Deep vein thrombosis °-The patient has a popliteal DVT in his right lower extremity in the  popliteal vein.  Typically DVT in the popliteal vein does not require intervention.  The patient should begin conservative therapy approximately a week after discharge including use of compression socks elevation and activity.  We will have the patient follow-up approximately 4 weeks or so in the office to repeat DVT studies ° °Venus Ruhe E Luvada Salamone, NP ° °03/25/2021 °8:46 PM ° ° ° °This note was created with Dragon medical transcription system.  Any error is purely unintentional  °

## 2021-03-25 NOTE — Progress Notes (Signed)
ANTICOAGULATION CONSULT NOTE   Pharmacy Consult for heparin infusion Indication: pulmonary embolus  No Known Allergies  Patient Measurements: Height: 6\' 4"  (193 cm) Weight: 127 kg (280 lb) IBW/kg (Calculated) : 86.8 Heparin Dosing Weight: 114.1 kg  Vital Signs: Temp: 98.2 F (36.8 C) (02/21 1947) BP: 126/82 (02/21 1947) Pulse Rate: 62 (02/21 1947)  Labs:   Estimated Creatinine Clearance: 121.7 mL/min (by C-G formula based on SCr of 1.08 mg/dL). Heparin Dosing Weight: 114.1 kg  Medical History: History reviewed. No pertinent past medical history.  Assessment: Pt is 51 yo male presenting to ED with SOB & upper right abd pain being started on heparin for PE and DVT. 52  lower extremity shows noninclusive DVT right popliteal vein. Additional thrombus noted in superficial vein in the region of right gastrocnemius. Starting hypercoag w/u. Pharmacy consulted for mgmt of heparin gtt.  2/21 - Noted, ATIII activity 102% appropriate. Baseline labs WNL.   Date Time  HL Rate/Comment 0221 1400 0.50  Therapeutic x1; 1850 un/hr   0221 1959 0.43 Therapeutic x2; 1850 un/hr  Baseline Labs: aPTT - 28s INR - 1.1 Hgb - 16.3 Plts - 230  Goal of Therapy:  Heparin level 0.3-0.7 units/ml Monitor platelets by anticoagulation protocol: Yes   Plan:  Heparin level consecutively therapeutic.  **Pending ACL Ab's, Prothrombin gene, FVL, Homocystein, B2G Ab, protein C/S, Lupus AC. Continue heparin infusion at 1850 units/hr Check daily heparin levels with AM labs and CTM CBC daily while on heparin  0222, PharmD, [facility] Clinical Pharmacist 03/25/2021 8:58 PM

## 2021-03-25 NOTE — Progress Notes (Signed)
*  PRELIMINARY RESULTS* Echocardiogram 2D Echocardiogram has been performed.  Benjamin Dyer 03/25/2021, 2:17 PM

## 2021-03-25 NOTE — Progress Notes (Signed)
ANTICOAGULATION CONSULT NOTE   Pharmacy Consult for heparin infusion Indication: pulmonary embolus  No Known Allergies  Patient Measurements: Height: 6\' 4"  (193 cm) Weight: 127 kg (280 lb) IBW/kg (Calculated) : 86.8 Heparin Dosing Weight: 114.1 kg  Vital Signs: Temp: 98.6 F (37 C) (02/20 2251) Temp Source: Oral (02/20 2251) BP: 124/79 (02/21 0439) Pulse Rate: 57 (02/21 0439)  Labs: Recent Labs    03/24/21 2304 03/25/21 0123  HGB 16.3  --   HCT 47.3  --   PLT 230  --   CREATININE 1.08  --   CKTOTAL  --  80  TROPONINIHS 6 7    Estimated Creatinine Clearance: 121.7 mL/min (by C-G formula based on SCr of 1.08 mg/dL).   Medical History: No past medical history on file.  Assessment: Pt is 51 yo male presenting to ED with SOB & upper right abd pain being started on heparin for possible PE.  Goal of Therapy:  Heparin level 0.3-0.7 units/ml Monitor platelets by anticoagulation protocol: Yes   Plan:  Bolus 6800 units x 1 Start heparin infusion at 1850 units/hr Check HL in 6 hr after start of infusion CBC daily while on heparin  Renda Rolls, PharmD, [facility] 03/25/2021 6:15 AM

## 2021-03-25 NOTE — H&P (Signed)
History and Physical    Patient: Benjamin Dyer XBD:[id number] DOB: 1970-05-21 DOA: 03/25/2021 DOS: the patient was seen and examined on 03/25/2021 PCP: Glori Luis, MD  Patient coming from: Home  Chief Complaint:  Chief Complaint  Patient presents with   Abdominal Pain   Shoulder Pain    HPI: Benjamin Dyer is a 51 y.o. male with no medical history comes to the emergency room with complaints of right lower chest pain radiating to shoulder for one day. Patient describes pain as sharp and increases with respiration. Denies associated diaphoresis palpitations nausea vomiting. Patient also complained of right lower extremity pain past week. 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 Overall vital signs remain stable. Ultrasound lower extremity shows noninclusive DVT right popliteal vein. Additional thrombus noted in superficial vein in the region of right gastrocnemius.  Patient was started on IV heparin drip. He is hemodynamically stable being admitted for PE and DVT.  Patient cannot recall any family members with history of blood clot. No recent illness travel or any surgery. Does not take any hormones. Denies history of smoking.    Review of Systems: As mentioned in the history of present illness. All other systems reviewed and are negative. No past medical history on file. Past Surgical History:  Procedure Laterality Date   NO PAST SURGERIES     Social History:  reports that he has never smoked. He has never used smokeless tobacco. He reports that he does not drink alcohol and does not use drugs.  No Known Allergies  Family History  Problem Relation Age of Onset   Arthritis Mother    Prostate cancer Father    Mental illness Sister    Diabetes Maternal Uncle    Stroke Paternal Aunt     Prior to  Admission medications   Medication Sig Start Date End Date Taking? Authorizing Provider  Multiple Vitamin (MULTIVITAMIN) tablet Take 1 tablet by mouth daily.    [provider]    Physical Exam: Vitals:   03/24/21 2253 03/25/21 0124 03/25/21 0439 03/25/21 0715  BP:  128/83 124/79 127/73  Pulse:  60 (!) 57 80  Resp:  16 15 18   Temp:      TempSrc:      SpO2:  96% 97% 98%  Weight: 127 kg     Height: 6\' 4"  (1.93 m)     alert and oriented times three not an acute distress respiratory clear to auscultation. No respiratory distress. Overall rhonchi cardiovascular both heart sounds are normal no murmurs or gallops abdomen soft nontender neuro- nonfocal    Assessment and Plan:   Benjamin Dyer is a 51 y.o. male with no medical history comes to the emergency room with complaints of right lower chest pain radiating to shoulder for one day. Patient describes pain as sharp and increases with respiration. Denies associated diaphoresis palpitations nausea vomiting. Patient also complained of right lower extremity pain past week.   Acute bilateral pulmonary embolism Right lower extremity DVT popliteal vein -- hypercoaguable bloodwork sent -- vascular surgery Dr. Hall Busing to see patient if he is a candidate for thrombectomy -- patient hemodynamically stable -- sats 98% on room air -- continue IV heparin drip. Will add oral anticoagulation likely tomorrow. -- Patient will follow-up outpatient. Dr. 52 aware and will get appointment for patient. --  Echo of the heart  DVT prophylaxis -- on full anticoagulation    Advance Care Planning: full code  Consults: vascular surgery  Family Communication: family at bedside  Severity of Illness: The appropriate patient status for this patient is OBSERVATION. Observation status is judged to be reasonable and necessary in order to provide the required intensity of service to ensure the patient's safety. The patient's presenting symptoms,  physical exam findings, and initial radiographic and laboratory data in the context of their medical condition is felt to place them at decreased risk for further clinical deterioration. Furthermore, it is anticipated that the patient will be medically stable for discharge from the hospital within 2 midnights of admission.   Author: Enedina Finner, MD 03/25/2021 8:39 AM  For on call review [email or web address].

## 2021-03-25 NOTE — ED Provider Notes (Signed)
[facility] Provider Note    Event Date/Time   First MD Initiated Contact with Patient 03/25/21 [phone]     (approximate)   History   Abdominal Pain and Shoulder Pain   HPI  Benjamin Dyer is a 51 y.o. male who presents to the ED from home with a chief complaint of right lower chest/upper abdominal pain radiating into his right shoulder.  Symptoms x1 day.  Describes pain as sharp and associated with shortness of breath.  Denies associated diaphoresis, palpitations, nausea/vomiting or dizziness.  Denies recent fever, chills, cough, dysuria or diarrhea.  Patient is a Airline pilot.  Denies recent trauma, travel or hormone use.  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 Authorizing Provider  Multiple Vitamin (MULTIVITAMIN) tablet Take 1 tablet by mouth daily.    [provider]     Allergies  Patient has no known allergies.   Family History   Family History  Problem Relation Age of Onset   Arthritis Mother    Prostate cancer Father    Mental illness Sister    Diabetes Maternal Uncle    Stroke Paternal Aunt      Physical Exam  Triage Vital Signs: ED Triage Vitals  Enc Vitals Group     BP 03/24/21 2251 137/89     Pulse Rate 03/24/21 2251 64     Resp 03/24/21 2251 16     Temp 03/24/21 2251 98.6 F (37 C)     Temp Source 03/24/21 2251 Oral      SpO2 03/24/21 2251 97 %     Weight 03/24/21 2253 280 lb (127 kg)     Height 03/24/21 2253 6\' 4"  (1.93 m)     Head Circumference --      Peak Flow --      Pain Score 03/24/21 2252 7     Pain Loc --      Pain Edu? --      Excl. in [place]? --     Updated Vital Signs: BP 124/79 (BP Location: Right Arm)    Pulse (!) 57    Temp 98.6 F (37 C) (Oral)    Resp 15    Ht 6\' 4"  (1.93 m)    Wt 127 kg    SpO2 97%    BMI 34.08 kg/m    General: Awake, mild distress.  CV:  RRR.  Good peripheral perfusion.  Resp:  Normal effort.  CTAB.  Mild splinting.  No crepitus.  Mild right lateral ribs tender to palpation. Abd:  Minimally tender right upper quadrant without rebound or guarding.  No distention.  Other:  No calf tenderness or swelling BLE   ED Results / Procedures / Treatments  Labs (all labs ordered are listed, but only abnormal results are displayed) Labs Reviewed  COMPREHENSIVE METABOLIC PANEL - Abnormal;  Notable for the following components:      Result Value   Glucose, Bld 109 (*)    All other components within normal limits  CBC WITH DIFFERENTIAL/PLATELET - Abnormal; Notable for the following components:   Monocytes Absolute 1.2 (*)    All other components within normal limits  RESP PANEL BY RT-PCR (FLU A&B, COVID) ARPGX2  CULTURE, BLOOD (ROUTINE X 2)  CULTURE, BLOOD (ROUTINE X 2)  LIPASE, BLOOD  CK  URINALYSIS, ROUTINE W REFLEX MICROSCOPIC  LACTIC ACID, PLASMA  LACTIC ACID, PLASMA  PROCALCITONIN  APTT  PROTIME-INR  TROPONIN I (HIGH SENSITIVITY)  TROPONIN I (HIGH SENSITIVITY)     EKG  ED ECG REPORT I, Kelsy Polack J, the attending physician, personally viewed and interpreted this ECG.   Date: 03/25/2021  EKG Time: 2259  Rate: 65  Rhythm: normal sinus rhythm  Axis: RAD  Intervals:none  ST&T Change: Nonspecific    RADIOLOGY I have personally visualized and interpreted patient's chest x-ray, ultrasound abdomen, DVT ultrasound, CTA chest as well as noted the radiology  interpretation:  Chest x-ray: Small right pleural effusion, right basilar atelectasis versus infiltrates  RUQ ultrasound: Trace right pleural effusion, otherwise unremarkable  CTA chest: Bilateral lower lobe subsegmental PEs with right heart  RLE DVT ultrasound: Right popliteal vein DVT  Official radiology report(s): DG Chest 2 View  Result Date: 03/24/2021 CLINICAL DATA:  Right-sided upper abdominal pain and right-sided rib pain. EXAM: CHEST - 2 VIEW COMPARISON:  November 08, 2020 FINDINGS: Mildly low lung volumes are present. Mild right basilar atelectasis and/or infiltrate is seen. A very small right pleural effusion is noted. No pneumothorax is identified. The heart size and mediastinal contours are within normal limits. The visualized skeletal structures are unremarkable. IMPRESSION: 1. Mild right basilar atelectasis and/or infiltrate. 2. Very small right pleural effusion. Electronically Signed   By: Virgina Norfolk M.D.   On: 03/24/2021 23:17   CT Angio Chest PE W/Cm &/Or Wo Cm  Result Date: 03/25/2021 CLINICAL DATA:  Right chest pain. EXAM: CT ANGIOGRAPHY CHEST WITH CONTRAST TECHNIQUE: Multidetector CT imaging of the chest was performed using the standard protocol during bolus administration of intravenous contrast. Multiplanar CT image reconstructions and MIPs were obtained to evaluate the vascular anatomy. RADIATION DOSE REDUCTION: This exam was performed according to the departmental dose-optimization program which includes automated exposure control, adjustment of the mA and/or kV according to patient size and/or use of iterative reconstruction technique. CONTRAST:  114mL OMNIPAQUE IOHEXOL 350 MG/ML SOLN COMPARISON:  PA and lateral chest yesterday.  No prior chest CT. FINDINGS: Cardiovascular: There is mild cardiomegaly with a slight right chamber predominance, mild degree of IVC reflux. Pulmonary arteries are normal in caliber. There are embolic filling defects in multiple right lower  lobe basal subsegmental arteries and in least 2 subsegmental left lower lobe posterior basal arteries and possibly a few downstream small divisions of these arteries. There is no pericardial effusion , no visible coronary artery calcification. There are trace calcifications in the aortic annulus. The aorta is within normal caliber limits with normal great vessels. The pulmonary veins are normal caliber. There is a prominent azygous venous confluence incidentally seen. Mediastinum/Nodes: There is a slightly prominent lymph node in the inferior right hilum measuring 1 cm in short axis. There are no further intrathoracic adenopathy. Visualized thyroid gland, axillary spaces are unremarkable. The thoracic esophagus is unremarkable. The trachea is free of filling defects. Lungs/Pleura: There is a small layering right and trace left pleural effusions. There is patchy  consolidation mixed with linear opacities in the right lower lobe, which could be due to pulmonary infarcts, pneumonic infiltrates or combination. There are linear atelectatic foci in the right middle lobe and base of the right upper lobe, and in the left lower lobe in the setting of low lung volumes. The remaining lungs clear.  Central airways are clear. Upper Abdomen: No acute abnormality. Musculoskeletal: There is chronic wedging and endplate irregularities in the lower half of the thoracic spine with degenerative disc disease and scattered endplate Schmorl's nodes. Findings could be due to old trauma or childhood Scheuermann's disease. Review of the MIP images confirms the above findings. IMPRESSION: 1. Right-greater-than-left bilateral lower lobe subsegmental arterial emboli. 2. Overall small clot burden but with a least mild findings of right heart strain noted. 3. Small right pleural effusion, with opacities in the right lower lobe consistent with pneumonic infiltrates and/or infarcts. 4. Single slightly prominent right hilar lymph node possibly  reactive. 5. Follow-up study recommended after treatment. 6. Results phoned to Dr. Beather Arbour at 5:58 a.m., 03/25/2021. Electronically Signed   By: Telford Nab M.D.   On: 03/25/2021 06:05   US Venous Img Lower Unilateral Right (DVT)  Result Date: 03/25/2021 CLINICAL DATA:  51 year old male with history of right lower extremity pain. EXAM: RIGHT LOWER EXTREMITY VENOUS DOPPLER ULTRASOUND TECHNIQUE: Gray-scale sonography with compression, as well as color and duplex ultrasound, were performed to evaluate the deep venous system(s) from the level of the common femoral vein through the popliteal and proximal calf veins. COMPARISON:  No priors. FINDINGS: VENOUS Normal compressibility of the common femoral, and superficial femoral veins, as well as the visualized deep calf veins. However, there was some echogenic material within the popliteal vein which is compatible with thrombus, which appears nonocclusive at this time. There also appears to be some superficial thrombus in a gastrocnemius vein in the right calf region. Visualized portions of profunda femoral vein and great saphenous vein unremarkable. No other filling defects to suggest DVT on grayscale or color Doppler imaging. Doppler waveforms show normal direction of venous flow, normal respiratory plasticity and response to augmentation. Limited views of the contralateral common femoral vein are unremarkable. OTHER None. Limitations: none IMPRESSION: 1. Study is positive for nonocclusive deep venous thrombosis in the right popliteal vein. Additional thrombus is noted in some superficial veins in the region of the right gastrocnemius. Negative. Electronically Signed   By: Vinnie Langton M.D.   On: 03/25/2021 06:39   US Abdomen Limited RUQ (LIVER/GB)  Result Date: 03/25/2021 CLINICAL DATA:  Right upper quadrant pain. EXAM: ULTRASOUND ABDOMEN LIMITED RIGHT UPPER QUADRANT COMPARISON:  None. FINDINGS: Gallbladder: The gallbladder is mildly contracted. No gallstones  or wall thickening visualized (2.8 mm). No sonographic Murphy sign noted by sonographer. Common bile duct: Diameter: 3.3 mm Liver: No focal lesion identified. Within normal limits in parenchymal echogenicity. Portal vein is patent on color Doppler imaging with normal direction of blood flow towards the liver. Other: A trace amount of pleural fluid is suspected on the right. IMPRESSION: 1. Trace right pleural effusion. 2. Otherwise, unremarkable right upper quadrant ultrasound. Electronically Signed   By: Virgina Norfolk M.D.   On: 03/25/2021 00:32     PROCEDURES:  Critical Care performed: Yes, see critical care procedure note(s)  CRITICAL CARE Performed by: Paulette Blanch   Total critical care time: 45 minutes  Critical care time was exclusive of separately billable procedures and treating other patients.  Critical care was necessary to treat or prevent imminent or  life-threatening deterioration.  Critical care was time spent personally by me on the following activities: development of treatment plan with patient and/or surrogate as well as nursing, discussions with consultants, evaluation of patient's response to treatment, examination of patient, obtaining history from patient or surrogate, ordering and performing treatments and interventions, ordering and review of laboratory studies, ordering and review of radiographic studies, pulse oximetry and re-evaluation of patient's condition.   Marland Kitchen1-3 Lead EKG Interpretation Performed by: Paulette Blanch, MD Authorized by: Paulette Blanch, MD     Interpretation: normal     ECG rate:  60   ECG rate assessment: normal     Rhythm: sinus rhythm     Ectopy: none     Conduction: normal   Comments:     Patient placed on cardiac monitor to evaluate for arrhythmias   MEDICATIONS ORDERED IN ED: Medications  heparin bolus via infusion 6,800 Units (has no administration in time range)  heparin ADULT infusion 100 units/mL (25000 units/215mL) (has no  administration in time range)  sodium chloride 0.9 % bolus 1,000 mL (1,000 mLs Intravenous New Bag/Given 03/25/21 0451)  ketorolac (TORADOL) 30 MG/ML injection 15 mg (15 mg Intravenous Given 03/25/21 0451)  iohexol (OMNIPAQUE) 350 MG/ML injection 100 mL (100 mLs Intravenous Contrast Given 03/25/21 0531)     IMPRESSION / MDM / ASSESSMENT AND PLAN / ED COURSE  I reviewed the triage vital signs and the nursing notes.                             51 year old male presenting with lower chest/rib pain/right upper quadrant abdominal pain. Differential diagnosis includes, but is not limited to, ACS, aortic dissection, pulmonary embolism, cardiac tamponade, pneumothorax, pneumonia, pericarditis, myocarditis, GI-related causes including esophagitis/gastritis, and musculoskeletal chest wall pain.   I have personally reviewed patient's records and note PCP office visit from 06/30/2019.  Spouse tells me that patient's aunt recently died from blood clot and he was complaining of right leg pain several days ago which resolved by itself.  The patient is on the cardiac monitor to evaluate for evidence of arrhythmia and/or significant heart rate changes.  Laboratory results demonstrate normal WBC 10.2, normal electrolytes, LFTs and lipase.  Respiratory panel was negative.  2 sets of troponin negative.  Given patient's complaints, will check CK, obtain CTA chest to evaluate for PE, RLE DVT ultrasound.  Administer IV fluids, IV ketorolac and reassess.  Clinical Course as of 03/25/21 0655  Tue Mar 25, 2021  0603 Discussed CT scan with radiologist positive for subsegmental PEs with right heart strain.  Have ordered heparin bolus with drip.  Updated patient and family member.  Will consult hospitalist services for evaluation and admission. [JS]    Clinical Course User Index [JS] Paulette Blanch, MD     FINAL CLINICAL IMPRESSION(S) / ED DIAGNOSES   Final diagnoses:  RUQ pain  Chest pain, unspecified type  Other acute  pulmonary embolism with acute cor pulmonale (HCC)  Acute deep vein thrombosis (DVT) of popliteal vein of right lower extremity ([place])     Rx / DC Orders   ED Discharge Orders     None        Note:  This document was prepared using Dragon voice recognition software and may include unintentional dictation errors.   Paulette Blanch, MD 03/25/21 [phone]

## 2021-03-25 NOTE — ED Notes (Signed)
Pt to CT

## 2021-03-25 NOTE — ED Notes (Signed)
Informed RN bed assigned 

## 2021-03-25 NOTE — Progress Notes (Signed)
ANTICOAGULATION CONSULT NOTE   Pharmacy Consult for heparin infusion Indication: pulmonary embolus  No Known Allergies  Patient Measurements: Height: 6\' 4"  (193 cm) Weight: 127 kg (280 lb) IBW/kg (Calculated) : 86.8 Heparin Dosing Weight: 114.1 kg  Vital Signs: BP: 118/62 (02/21 1100) Pulse Rate: 57 (02/21 1100)  Labs: Recent Labs    03/24/21 2304 03/25/21 0123 03/25/21 0708 03/25/21 1400  HGB 16.3  --   --   --   HCT 47.3  --   --   --   PLT 230  --   --   --   APTT  --   --  28  --   LABPROT  --   --  14.1  --   INR  --   --  1.1  --   HEPARINUNFRC  --   --   --  0.50  CREATININE 1.08  --   --   --   CKTOTAL  --  80  --   --   TROPONINIHS 6 7  --   --      Estimated Creatinine Clearance: 121.7 mL/min (by C-G formula based on SCr of 1.08 mg/dL).   Medical History: No past medical history on file.  Assessment: Pt is 51 yo male presenting to ED with SOB & upper right abd pain being started on heparin for PE and DVT. 52  lower extremity shows noninclusive DVT right popliteal vein. Additional thrombus noted in superficial vein in the region of right gastrocnemius. Starting hypercoag w/u. Pharmacy consulted for mgmt of heparin gtt.   Date Time  HL Rate/Comment 0221 1400 0.50  Therapeutic; 1850 un/hr    Baseline Labs: aPTT - 28s INR - 1.1 Hgb - 16.3 Plts - 230  Goal of Therapy:  Heparin level 0.3-0.7 units/ml Monitor platelets by anticoagulation protocol: Yes   Plan:  Heparin level therapeutic x1. Noted, ATIII activity 102% appropriate. Baseline labs WNL. **Pending ACL Ab's, Prothrombin gene, FVL, Homocystein, B2G Ab, protein C/S, Lupus AC. Continue heparin infusion at 1850 units/hr Check confirmatory HL in 6 hr; then daily HL once consecutively therapeutic CBC daily while on heparin  [place], PharmD, [facility] Clinical Pharmacist 03/25/2021 3:44 PM

## 2021-03-25 NOTE — ED Notes (Signed)
Pt updated on POC and NPO status effective at midnight.  Pt given menu to order food

## 2021-03-25 NOTE — Consult Note (Signed)
Benjamin Dyer  MRN : [id number]  Benjamin Dyer is a 51 y.o. (October 02, 1970) male who presents with chief complaint of  Chief Complaint  Patient presents with   Abdominal Pain   Shoulder Pain  .  History of Present Illness: We are asked to consult by Dr. Posey Pronto in regards to a pulmonary embolism and DVT.  Benjamin Dyer is a 51 year old male with no significant medical history that presented to the emergency room today due to having right lower chest pain and shoulder pain that have been persistent for the day.  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  The patient reports traveling fairly frequently for their daughter's sports with most recent travel to [place].  In the midst of work-up the patient had a lower extremity ultrasound that showed a nonocclusive popliteal DVT in the right lower extremity as well as in the gastrocnemius.  Patient also had a CT scan which revealed bilateral pulmonary embolus with concern of mild right heart strain.  Current Facility-Administered Medications  Medication Dose Route Frequency Provider Last Rate Last Admin   acetaminophen (TYLENOL) tablet 650 mg  650 mg Oral Q6H PRN Fritzi Mandes, MD       Or   acetaminophen (TYLENOL) suppository 650 mg  650 mg Rectal Q6H PRN Fritzi Mandes, MD       heparin ADULT infusion 100 units/mL (25000 units/279mL)  1,850 Units/hr Intravenous Continuous Renda Rolls, RPH 18.5 mL/hr at 03/25/21 1729 1,850 Units/hr at 03/25/21 1729   ondansetron (ZOFRAN) tablet 4 mg  4 mg Oral Q6H PRN Fritzi Mandes, MD       Or   ondansetron [facility]) injection 4 mg  4 mg Intravenous Q6H PRN Fritzi Mandes, MD       oxyCODONE (Oxy IR/ROXICODONE) immediate release tablet 5 mg  5 mg Oral Q4H PRN Fritzi Mandes, MD       polyethylene glycol (MIRALAX / GLYCOLAX) packet 17 g  17 g Oral Daily PRN Fritzi Mandes, MD        History reviewed. No pertinent past medical history.  Past  Surgical History:  Procedure Laterality Date   NO PAST SURGERIES      Social History Social History   Tobacco Use   Smoking status: Never   Smokeless tobacco: Never  Vaping Use   Vaping Use: Never used  Substance Use Topics   Alcohol use: No   Drug use: No    Family History Family History  Problem Relation Age of Onset   Arthritis Mother    Prostate cancer Father    Mental illness Sister    Diabetes Maternal Uncle    Stroke Paternal Aunt     No Known Allergies   REVIEW OF SYSTEMS (Negative unless checked)  Constitutional: [] Weight loss  [] Fever  [] Chills Cardiac: [x] Chest pain   [] Chest pressure   [] Palpitations   [] Shortness of breath when laying flat   [] Shortness of breath at rest   [] Shortness of breath with exertion. Vascular:  [] Pain in legs with walking   [] Pain in legs at rest   [] Pain in legs when laying flat   [] Claudication   [] Pain in feet when walking  [] Pain in feet at rest  [] Pain in feet when laying flat   [] History of DVT   [] Phlebitis   [] Swelling in legs   [] Varicose veins   [] Non-healing ulcers Pulmonary:   [] Uses home oxygen   [] Productive cough   []   Hemoptysis   [] Wheeze  [] COPD   [] Asthma Neurologic:  [] Dizziness  [] Blackouts   [] Seizures   [] History of stroke   [] History of TIA  [] Aphasia   [] Temporary blindness   [] Dysphagia   [] Weakness or numbness in arms   [] Weakness or numbness in legs Musculoskeletal:  [] Arthritis   [] Joint swelling   [] Joint pain   [] Low back pain Hematologic:  [] Easy bruising  [] Easy bleeding   [] Hypercoagulable state   [] Anemic  [] Hepatitis Gastrointestinal:  [] Blood in stool   [] Vomiting blood  [] Gastroesophageal reflux/heartburn   [] Difficulty swallowing. Genitourinary:  [] Chronic kidney disease   [] Difficult urination  [] Frequent urination  [] Burning with urination   [] Blood in urine Skin:  [] Rashes   [] Ulcers   [] Wounds Psychological:  [] History of anxiety   []  History of major depression.  Physical  Examination  Vitals:   03/25/21 1030 03/25/21 1100 03/25/21 1825 03/25/21 1947  BP: 127/68 118/62 124/73 126/82  Pulse: (!) 50 (!) 57 62 62  Resp: 20 17 16 15   Temp:   99.1 F (37.3 C) 98.2 F (36.8 C)  TempSrc:      SpO2: 98% 95% 98% 98%  Weight:      Height:       Body mass index is 34.08 kg/m. Gen:  WD/WN, NAD Head: [place]/AT, No temporalis wasting. Prominent temp pulse not noted. Ear/Nose/Throat: Hearing grossly intact, nares w/o erythema or drainage, oropharynx w/o Erythema/Exudate Eyes: Sclera non-icteric, conjunctiva clear Neck: Trachea midline.  No JVD.  Pulmonary:  Good air movement, some shortness of breath noted  Musculoskeletal: M/S 5/5 throughout.  Extremities without ischemic changes.  No deformity or atrophy. No edema. Neurologic: Sensation grossly intact in extremities.  Symmetrical.  Speech is fluent. Motor exam as listed above. Psychiatric: Judgment intact, Mood & affect appropriate for pt's clinical situation. Dermatologic: No rashes or ulcers noted.  No cellulitis or open wounds.      CBC Lab Results  Component Value Date   WBC 10.2 03/24/2021   HGB 16.3 03/24/2021   HCT 47.3 03/24/2021   MCV 87.6 03/24/2021   PLT 230 03/24/2021    BMET    Component Value Date/Time   NA 139 03/24/2021 2304   NA 139 11/09/2019 0845   K 3.7 03/24/2021 2304   CL 100 03/24/2021 2304   CO2 27 03/24/2021 2304   GLUCOSE 109 (H) 03/24/2021 2304   BUN 18 03/24/2021 2304   BUN 18 11/09/2019 0845   CREATININE 1.08 03/24/2021 2304   CALCIUM 9.5 03/24/2021 2304   GFRNONAA >60 03/24/2021 2304   GFRAA 90 11/09/2019 0845   Estimated Creatinine Clearance: 121.7 mL/min (by C-G formula based on SCr of 1.08 mg/dL).  COAG Lab Results  Component Value Date   INR 1.1 03/25/2021    Radiology DG Chest 2 View  Result Date: 03/24/2021 CLINICAL DATA:  Right-sided upper abdominal pain and right-sided rib pain. EXAM: CHEST - 2 VIEW COMPARISON:  November 08, 2020 FINDINGS: Mildly  low lung volumes are present. Mild right basilar atelectasis and/or infiltrate is seen. A very small right pleural effusion is noted. No pneumothorax is identified. The heart size and mediastinal contours are within normal limits. The visualized skeletal structures are unremarkable. IMPRESSION: 1. Mild right basilar atelectasis and/or infiltrate. 2. Very small right pleural effusion. Electronically Signed   By: Virgina Norfolk M.D.   On: 03/24/2021 23:17   CT Angio Chest PE W/Cm &/Or Wo Cm  Result Date: 03/25/2021 CLINICAL DATA:  Right chest pain. EXAM:  CT ANGIOGRAPHY CHEST WITH CONTRAST TECHNIQUE: Multidetector CT imaging of the chest was performed using the standard protocol during bolus administration of intravenous contrast. Multiplanar CT image reconstructions and MIPs were obtained to evaluate the vascular anatomy. RADIATION DOSE REDUCTION: This exam was performed according to the departmental dose-optimization program which includes automated exposure control, adjustment of the mA and/or kV according to patient size and/or use of iterative reconstruction technique. CONTRAST:  114mL OMNIPAQUE IOHEXOL 350 MG/ML SOLN COMPARISON:  PA and lateral chest yesterday.  No prior chest CT. FINDINGS: Cardiovascular: There is mild cardiomegaly with a slight right chamber predominance, mild degree of IVC reflux. Pulmonary arteries are normal in caliber. There are embolic filling defects in multiple right lower lobe basal subsegmental arteries and in least 2 subsegmental left lower lobe posterior basal arteries and possibly a few downstream small divisions of these arteries. There is no pericardial effusion , no visible coronary artery calcification. There are trace calcifications in the aortic annulus. The aorta is within normal caliber limits with normal great vessels. The pulmonary veins are normal caliber. There is a prominent azygous venous confluence incidentally seen. Mediastinum/Nodes: There is a slightly  prominent lymph node in the inferior right hilum measuring 1 cm in short axis. There are no further intrathoracic adenopathy. Visualized thyroid gland, axillary spaces are unremarkable. The thoracic esophagus is unremarkable. The trachea is free of filling defects. Lungs/Pleura: There is a small layering right and trace left pleural effusions. There is patchy consolidation mixed with linear opacities in the right lower lobe, which could be due to pulmonary infarcts, pneumonic infiltrates or combination. There are linear atelectatic foci in the right middle lobe and base of the right upper lobe, and in the left lower lobe in the setting of low lung volumes. The remaining lungs clear.  Central airways are clear. Upper Abdomen: No acute abnormality. Musculoskeletal: There is chronic wedging and endplate irregularities in the lower half of the thoracic spine with degenerative disc disease and scattered endplate Schmorl's nodes. Findings could be due to old trauma or childhood Scheuermann's disease. Review of the MIP images confirms the above findings. IMPRESSION: 1. Right-greater-than-left bilateral lower lobe subsegmental arterial emboli. 2. Overall small clot burden but with a least mild findings of right heart strain noted. 3. Small right pleural effusion, with opacities in the right lower lobe consistent with pneumonic infiltrates and/or infarcts. 4. Single slightly prominent right hilar lymph node possibly reactive. 5. Follow-up study recommended after treatment. 6. Results phoned to Dr. Beather Arbour at 5:58 a.m., 03/25/2021. Electronically Signed   By: Telford Nab M.D.   On: 03/25/2021 06:05   US Venous Img Lower Unilateral Right (DVT)  Result Date: 03/25/2021 CLINICAL DATA:  51 year old male with history of right lower extremity pain. EXAM: RIGHT LOWER EXTREMITY VENOUS DOPPLER ULTRASOUND TECHNIQUE: Gray-scale sonography with compression, as well as color and duplex ultrasound, were performed to evaluate the deep  venous system(s) from the level of the common femoral vein through the popliteal and proximal calf veins. COMPARISON:  No priors. FINDINGS: VENOUS Normal compressibility of the common femoral, and superficial femoral veins, as well as the visualized deep calf veins. However, there was some echogenic material within the popliteal vein which is compatible with thrombus, which appears nonocclusive at this time. There also appears to be some superficial thrombus in a gastrocnemius vein in the right calf region. Visualized portions of profunda femoral vein and great saphenous vein unremarkable. No other filling defects to suggest DVT on grayscale or color Doppler imaging.  Doppler waveforms show normal direction of venous flow, normal respiratory plasticity and response to augmentation. Limited views of the contralateral common femoral vein are unremarkable. OTHER None. Limitations: none IMPRESSION: 1. Study is positive for nonocclusive deep venous thrombosis in the right popliteal vein. Additional thrombus is noted in some superficial veins in the region of the right gastrocnemius. Negative. Electronically Signed   By: Vinnie Langton M.D.   On: 03/25/2021 06:39   ECHOCARDIOGRAM COMPLETE  Result Date: 03/25/2021    ECHOCARDIOGRAM REPORT   Patient Name:   Benjamin Dyer Date of Exam: 03/25/2021 Medical Rec #:  [id number]           Height:       76.0 in Accession #:    [id number]          Weight:       280.0 lb Date of Birth:  [birth year]           BSA:          2.556 m Patient Age:    46 years            BP:           118/62 mmHg Patient Gender: M                   HR:           57 bpm. Exam Location:  [facility] Procedure: 2D Echo, Cardiac Doppler, Color Doppler and Saline Contrast Bubble            Study Indications:     Pulmonary Embolus I26.09  History:         Patient has no prior history of Echocardiogram examinations. No                  past medical history on file.  Sonographer:     Sherrie Sport Referring Phys:   [id number] SONA PATEL Diagnosing Phys: Nelva Bush MD IMPRESSIONS  1. Left ventricular ejection fraction, by estimation, is 55 to 60%. The left ventricle has normal function. Left ventricular endocardial border not optimally defined to evaluate regional wall motion. Left ventricular diastolic parameters were normal.  2. Right ventricular systolic function is normal. The right ventricular size is normal.  3. Left atrial size was mild to moderately dilated.  4. Right atrial size was mild to moderately dilated.  5. The mitral valve is grossly normal. Trivial mitral valve regurgitation. No evidence of mitral stenosis.  6. The aortic valve has an indeterminant number of cusps. Aortic valve regurgitation is not visualized. No aortic stenosis is present.  7. Agitated saline contrast bubble study was negative, with no evidence of any interatrial shunt. FINDINGS  Left Ventricle: Left ventricular ejection fraction, by estimation, is 55 to 60%. The left ventricle has normal function. Left ventricular endocardial border not optimally defined to evaluate regional wall motion. The left ventricular internal cavity size was normal in size. There is no left ventricular hypertrophy. Left ventricular diastolic parameters were normal. Right Ventricle: The right ventricular size is normal. No increase in right ventricular wall thickness. Right ventricular systolic function is normal. Left Atrium: Left atrial size was mild to moderately dilated. Right Atrium: Right atrial size was mild to moderately dilated. Pericardium: There is no evidence of pericardial effusion. Mitral Valve: The mitral valve is grossly normal. Trivial mitral valve regurgitation. No evidence of mitral valve stenosis. MV peak gradient, 3.1 mmHg. The mean mitral valve gradient is 1.0 mmHg. Tricuspid Valve: The tricuspid  valve is normal in structure. Tricuspid valve regurgitation is trivial. Aortic Valve: The aortic valve has an indeterminant number of cusps. Aortic valve  regurgitation is not visualized. No aortic stenosis is present. Aortic valve mean gradient measures 4.5 mmHg. Aortic valve peak gradient measures 7.8 mmHg. Aortic valve area, by VTI measures 2.68 cm. Pulmonic Valve: The pulmonic valve was not well visualized. Pulmonic valve regurgitation is trivial. No evidence of pulmonic stenosis. Aorta: The aortic root is normal in size and structure. Pulmonary Artery: The pulmonary artery is not well seen. Venous: The inferior vena cava was not well visualized. IAS/Shunts: The interatrial septum was not well visualized. Agitated saline contrast was given intravenously to evaluate for intracardiac shunting. Agitated saline contrast bubble study was negative, with no evidence of any interatrial shunt.  LEFT VENTRICLE PLAX 2D LVIDd:         4.50 cm   Diastology LVIDs:         3.00 cm   LV e' medial:    9.46 cm/s LV PW:         1.05 cm   LV E/e' medial:  7.3 LV IVS:        0.93 cm   LV e' lateral:   7.18 cm/s LVOT diam:     2.00 cm   LV E/e' lateral: 9.7 LV SV:         76 LV SV Index:   30 LVOT Area:     3.14 cm  RIGHT VENTRICLE RV Basal diam:  3.10 cm RV S prime:     14.90 cm/s TAPSE (M-mode): 3.9 cm LEFT ATRIUM              Index        RIGHT ATRIUM           Index LA diam:        3.80 cm  1.49 cm/m   RA Area:     26.80 cm LA Vol (A2C):   71.4 ml  27.94 ml/m  RA Volume:   84.70 ml  33.14 ml/m LA Vol (A4C):   106.0 ml 41.47 ml/m LA Biplane Vol: 90.6 ml  35.45 ml/m  AORTIC VALVE                    PULMONIC VALVE AV Area (Vmax):    2.47 cm     PV Vmax:        0.91 m/s AV Area (Vmean):   2.39 cm     PV Vmean:       60.500 cm/s AV Area (VTI):     2.68 cm     PV VTI:         0.201 m AV Vmax:           139.50 cm/s  PV Peak grad:   3.3 mmHg AV Vmean:          98.000 cm/s  PV Mean grad:   2.0 mmHg AV VTI:            0.285 m      RVOT Peak grad: 7 mmHg AV Peak Grad:      7.8 mmHg AV Mean Grad:      4.5 mmHg LVOT Vmax:         109.50 cm/s LVOT Vmean:        74.650 cm/s LVOT VTI:           0.242 m LVOT/AV VTI ratio: 0.85  AORTA Ao Root diam: 3.03  cm MITRAL VALVE MV Area (PHT): 2.99 cm    SHUNTS MV Area VTI:   2.81 cm    Systemic VTI:  0.24 m MV Peak grad:  3.1 mmHg    Systemic Diam: 2.00 cm MV Mean grad:  1.0 mmHg    Pulmonic VTI:  0.216 m MV Vmax:       0.88 m/s MV Vmean:      42.0 cm/s MV Decel Time: 254 msec MV E velocity: 69.40 cm/s MV A velocity: 58.70 cm/s MV E/A ratio:  1.18 Nelva Bush MD Electronically signed by Nelva Bush MD Signature Date/Time: 03/25/2021/7:35:24 PM    Final    US Abdomen Limited RUQ (LIVER/GB)  Result Date: 03/25/2021 CLINICAL DATA:  Right upper quadrant pain. EXAM: ULTRASOUND ABDOMEN LIMITED RIGHT UPPER QUADRANT COMPARISON:  None. FINDINGS: Gallbladder: The gallbladder is mildly contracted. No gallstones or wall thickening visualized (2.8 mm). No sonographic Murphy sign noted by sonographer. Common bile duct: Diameter: 3.3 mm Liver: No focal lesion identified. Within normal limits in parenchymal echogenicity. Portal vein is patent on color Doppler imaging with normal direction of blood flow towards the liver. Other: A trace amount of pleural fluid is suspected on the right. IMPRESSION: 1. Trace right pleural effusion. 2. Otherwise, unremarkable right upper quadrant ultrasound. Electronically Signed   By: Virgina Norfolk M.D.   On: 03/25/2021 00:32      Assessment/Plan Pulmonary Embolism -Patient is acutely symptomatic with evidence of right heart strain, based on his age and symptoms  pulmonary thrombectomy would benefit the patient and quick resolution of symptoms as well as helping him to heal in the long-term.  We discussed the risk, benefits and alternatives the patient agrees to proceed.  We will plan on intervening tomorrow. -The patient's pulmonary embolism was likely provoked.  Recommend anticoagulation for minimum of at least 1 year  Deep vein thrombosis -The patient has a popliteal DVT in his right lower extremity in the  popliteal vein.  Typically DVT in the popliteal vein does not require intervention.  The patient should begin conservative therapy approximately a week after discharge including use of compression socks elevation and activity.  We will have the patient follow-up approximately 4 weeks or so in the office to repeat DVT studies  Kris Hartmann, NP  03/25/2021 8:46 PM    This Dyer was created with Dragon medical transcription system.  Any error is purely unintentional

## 2021-03-26 ENCOUNTER — Encounter
Admission: EM | Disposition: A | Discharge status: Home / Self Care | Payer: Self-pay | Source: Home / Self Care | Attending: Internal Medicine

## 2021-03-26 DIAGNOSIS — I82431 Acute embolism and thrombosis of right popliteal vein: Secondary | ICD-10-CM | POA: Diagnosis present

## 2021-03-26 DIAGNOSIS — I2609 Other pulmonary embolism with acute cor pulmonale: Secondary | ICD-10-CM | POA: Diagnosis present

## 2021-03-26 DIAGNOSIS — Z823 Family history of stroke: Secondary | ICD-10-CM | POA: Diagnosis not present

## 2021-03-26 DIAGNOSIS — R1011 Right upper quadrant pain: Secondary | ICD-10-CM | POA: Diagnosis present

## 2021-03-26 DIAGNOSIS — Z833 Family history of diabetes mellitus: Secondary | ICD-10-CM | POA: Diagnosis not present

## 2021-03-26 DIAGNOSIS — I2699 Other pulmonary embolism without acute cor pulmonale: Secondary | ICD-10-CM | POA: Diagnosis not present

## 2021-03-26 DIAGNOSIS — Z8261 Family history of arthritis: Secondary | ICD-10-CM | POA: Diagnosis not present

## 2021-03-26 DIAGNOSIS — R0902 Hypoxemia: Secondary | ICD-10-CM | POA: Diagnosis present

## 2021-03-26 DIAGNOSIS — I82461 Acute embolism and thrombosis of right calf muscular vein: Secondary | ICD-10-CM | POA: Diagnosis present

## 2021-03-26 DIAGNOSIS — Z20822 Contact with and (suspected) exposure to covid-19: Secondary | ICD-10-CM | POA: Diagnosis present

## 2021-03-26 DIAGNOSIS — Z8042 Family history of malignant neoplasm of prostate: Secondary | ICD-10-CM | POA: Diagnosis not present

## 2021-03-26 HISTORY — PX: PULMONARY THROMBECTOMY: CATH118295

## 2021-03-26 LAB — CARDIOLIPIN ANTIBODIES, IGG, IGM, IGA
Anticardiolipin IgA: <9 APL U/mL (ref 0–11)
Anticardiolipin IgG: <9 GPL U/mL (ref 0–14)
Anticardiolipin IgM: <9 MPL U/mL (ref 0–12)

## 2021-03-26 LAB — CBC
HCT: 45.4 % (ref 39.0–52.0)
Hemoglobin: 15.7 g/dL (ref 13.0–17.0)
MCH: 29.8 pg (ref 26.0–34.0)
MCHC: 34.6 g/dL (ref 30.0–36.0)
MCV: 86.3 fL (ref 80.0–100.0)
Platelets: 197 10*3/uL (ref 150–400)
RBC: 5.26 MIL/uL (ref 4.22–5.81)
RDW: 12.1 % (ref 11.5–15.5)
WBC: 9.7 10*3/uL (ref 4.0–10.5)
nRBC: 0 % (ref 0.0–0.2)

## 2021-03-26 LAB — HEPARIN LEVEL (UNFRACTIONATED): Heparin Unfractionated: 0.41 IU/mL (ref 0.30–0.70)

## 2021-03-26 LAB — CREATININE, SERUM
Creatinine, Ser: 0.98 mg/dL (ref 0.61–1.24)
GFR, Estimated: >60 mL/min (ref >60)

## 2021-03-26 LAB — BUN: BUN: 16 mg/dL (ref 6–20)

## 2021-03-26 LAB — HOMOCYSTEINE: Homocysteine: 8.2 umol/L (ref 0.0–14.5)

## 2021-03-26 SURGERY — PULMONARY THROMBECTOMY
Anesthesia: Moderate Sedation | Laterality: Bilateral

## 2021-03-26 MED ORDER — MIDAZOLAM HCL 2 MG/2ML IJ SOLN
INTRAMUSCULAR | Status: DC | PRN
  Administered 2021-03-26: 2 mg via INTRAVENOUS

## 2021-03-26 MED ORDER — HYDROMORPHONE HCL 1 MG/ML IJ SOLN: 1.0000 mg | Freq: Once | INTRAMUSCULAR | Status: DC | PRN

## 2021-03-26 MED ORDER — FENTANYL CITRATE (PF) 100 MCG/2ML IJ SOLN
INTRAMUSCULAR | Status: DC | PRN
  Administered 2021-03-26: 50 ug via INTRAVENOUS

## 2021-03-26 MED ORDER — FAMOTIDINE 20 MG PO TABS: 40.0000 mg | ORAL_TABLET | Freq: Once | ORAL | Status: DC | PRN

## 2021-03-26 MED ORDER — MIDAZOLAM HCL 2 MG/ML PO SYRP: 8.0000 mg | ORAL_SOLUTION | Freq: Once | ORAL | Status: DC | PRN

## 2021-03-26 MED ORDER — SODIUM CHLORIDE 0.9 % IV SOLN: INTRAVENOUS | Status: DC

## 2021-03-26 MED ORDER — ONDANSETRON HCL 4 MG/2ML IJ SOLN: 4.0000 mg | Freq: Four times a day (QID) | INTRAMUSCULAR | Status: DC | PRN

## 2021-03-26 MED ORDER — DIPHENHYDRAMINE HCL 50 MG/ML IJ SOLN: 50.0000 mg | Freq: Once | INTRAMUSCULAR | Status: DC | PRN

## 2021-03-26 MED ORDER — HEPARIN SODIUM (PORCINE) 1000 UNIT/ML IJ SOLN
INTRAMUSCULAR | Status: DC | PRN
Start: 2021-03-26 — End: 2021-03-26
  Administered 2021-03-26: 3000 [IU] via INTRAVENOUS

## 2021-03-26 MED ORDER — CEFAZOLIN SODIUM-DEXTROSE 2-4 GM/100ML-% IV SOLN: 2.0000 g | INTRAVENOUS | Status: DC

## 2021-03-26 MED ORDER — ALTEPLASE 2 MG IJ SOLR
INTRAMUSCULAR | Status: AC
  Filled 2021-03-26: qty 8

## 2021-03-26 MED ORDER — HEPARIN SODIUM (PORCINE) 1000 UNIT/ML IJ SOLN
INTRAMUSCULAR | Status: AC
  Filled 2021-03-26: qty 10

## 2021-03-26 MED ORDER — ALTEPLASE 1 MG/ML SYRINGE FOR VASCULAR PROCEDURE
INTRAMUSCULAR | Status: DC | PRN
  Administered 2021-03-26 (×2): 4 mg via INTRA_ARTERIAL

## 2021-03-26 MED ORDER — MIDAZOLAM HCL 2 MG/2ML IJ SOLN
INTRAMUSCULAR | Status: AC
  Filled 2021-03-26: qty 2

## 2021-03-26 MED ORDER — FENTANYL CITRATE PF 50 MCG/ML IJ SOSY
PREFILLED_SYRINGE | INTRAMUSCULAR | Status: AC
  Filled 2021-03-26: qty 1

## 2021-03-26 MED ORDER — METHYLPREDNISOLONE SODIUM SUCC 125 MG IJ SOLR: 125.0000 mg | Freq: Once | INTRAMUSCULAR | Status: DC | PRN

## 2021-03-26 SURGICAL SUPPLY — 16 items
CANISTER PENUMBRA ENGINE (MISCELLANEOUS) ×1 IMPLANT
CATH ANGIO 5F PIGTAIL 100CM (CATHETERS) ×1 IMPLANT
CATH INDIGO SEP 8 (CATHETERS) ×1 IMPLANT
CATH INFINITI JR4 5F (CATHETERS) ×1 IMPLANT
CATH LIGHTNING 8 XTORQ 115 (CATHETERS) ×1 IMPLANT
COVER PROBE U/S 5X48 (MISCELLANEOUS) ×1 IMPLANT
DEVICE SAFEGUARD 24CM (GAUZE/BANDAGES/DRESSINGS) ×1 IMPLANT
DEVICE TORQUE (MISCELLANEOUS) ×1 IMPLANT
GLIDEWIRE ADV .035X180CM (WIRE) ×1 IMPLANT
GLIDEWIRE ADV .035X260CM (WIRE) ×1 IMPLANT
PACK ANGIOGRAPHY (CUSTOM PROCEDURE TRAY) ×2 IMPLANT
SHEATH 9FRX11 (SHEATH) ×1 IMPLANT
SHEATH BRITE TIP 5FRX11 (SHEATH) ×1 IMPLANT
SYR MEDRAD MARK 7 150ML (SYRINGE) ×1 IMPLANT
TUBING CONTRAST HIGH PRESS 72 (TUBING) ×1 IMPLANT
WIRE GUIDERIGHT .035X150 (WIRE) ×1 IMPLANT

## 2021-03-26 NOTE — Interval H&P Note (Signed)
History and Physical Interval Note:  03/26/2021 1:38 PM  Benjamin Dyer  has presented today for surgery, with the diagnosis of Pulmonary embolism.  The various methods of treatment have been discussed with the patient and family. After consideration of risks, benefits and other options for treatment, the patient has consented to  Procedure(s): PULMONARY THROMBECTOMY (Bilateral) as a surgical intervention.  The patient's history has been reviewed, patient examined, no change in status, stable for surgery.  I have reviewed the patient's chart and labs.  Questions were answered to the patient's satisfaction.     [name]

## 2021-03-26 NOTE — TOC Initial Note (Signed)
Transition of Care [facility]) - Initial/Assessment Note    Patient Details  Name: Benjamin Dyer MRN: [id number] Date of Birth: December 02, 1970  Transition of Care [facility]) CM/SW Contact:    Marlowe Sax, RN Phone Number: 03/26/2021, 10:17 AM  Clinical Narrative:        Transition of Care [facility]) Screening Note   Patient Details  Name: Benjamin Dyer Date of Birth: 09/06/1970   Transition of Care [facility]) CM/SW Contact:    Marlowe Sax, RN Phone Number: 03/26/2021, 10:17 AM    Transition of Care Department [facility]) has reviewed patient and no TOC needs have been identified at this time. We will continue to monitor patient advancement through interdisciplinary progression rounds. If new patient transition needs arise, please place a TOC consult.                    Patient Goals and CMS Choice        Expected Discharge Plan and Services                                                Prior Living Arrangements/Services                       Activities of Daily Living Home Assistive Devices/Equipment: None ADL Screening (condition at time of admission) Patient's cognitive ability adequate to safely complete daily activities?: Yes Is the patient deaf or have difficulty hearing?: No Does the patient have difficulty seeing, even when wearing glasses/contacts?: No Does the patient have difficulty concentrating, remembering, or making decisions?: No Patient able to express need for assistance with ADLs?: Yes Does the patient have difficulty dressing or bathing?: No Independently performs ADLs?: Yes (appropriate for developmental age) Does the patient have difficulty walking or climbing stairs?: No Weakness of Legs: None Weakness of Arms/Hands: None  Permission Sought/Granted                  Emotional Assessment              Admission diagnosis:  RUQ pain [R10.11] PE (pulmonary thromboembolism) (HCC) [I26.99] Acute deep vein  thrombosis (DVT) of popliteal vein of right lower extremity (HCC) [I82.431] Other acute pulmonary embolism with acute cor pulmonale (HCC) [I26.09] Chest pain, unspecified type [R07.9] Patient Active Problem List   Diagnosis Date Noted   PE (pulmonary thromboembolism) (HCC) 03/25/2021   Back strain 06/30/2019   Exertional dyspnea 06/30/2018   Rib pain 06/30/2018   Skin lesion 06/30/2018   Encounter for general adult medical examination with abnormal findings 12/23/2015   Asymmetrical hearing loss, right 03/06/2014   PCP:  Glori Luis, MD Pharmacy:   [facility] [phone] - [place], [place] - [address] [place] [address] Phone: [phone] Fax: [phone]     Social Determinants of Health (SDOH) Interventions    Readmission Risk Interventions No flowsheet data found.

## 2021-03-26 NOTE — Op Note (Signed)
[facility]  Percutaneous Study/Intervention Procedural Note   Date of Surgery: 03/26/2021,3:02 PM  Surgeon: [name]  Pre-operative Diagnosis: Symptomatic bilateral pulmonary emboli  Post-operative diagnosis:  Same  Procedure(s) Performed:  1.  Contrast injection right heart  2.  Thrombolysis with 8 mg of tPA, 4 mg in each of the lower lobe pulmonary arteries  3.  Mechanical thrombectomy using the penumbra CAT 8 catheter to the left lower lobe pulmonary artery, and the right middle and right lower lobe pulmonary arteries  4.  Selective catheter placement right lower lobe, middle lobe, and upper lobe pulmonary artery  5.  Selective catheter placement left upper lobe and lower lobe pulmonary arteries    Anesthesia: Conscious sedation was administered under my direct supervision by the interventional radiology RN. IV Versed plus fentanyl were utilized. Continuous ECG, pulse oximetry and blood pressure was monitored throughout the entire procedure.  Versed and fentanyl were administered intravenously.  Conscious sedation was administered for a total of 32 minutes using 2 mg of Versed and 50 mcg of Fentanyl.  EBL: 400 cc  Sheath: 9 French right femoral vein  Contrast: 45 cc   Fluoroscopy Time: 10.2 minutes  Indications:  Patient presents with pulmonary emboli. The patient is symptomatic with hypoxemia and dyspnea on exertion.  There is evidence of right heart strain on the CT angiogram. The patient is otherwise a good candidate for intervention and even the long-term benefits pulmonary angiography with thrombolysis is offered. The risks and benefits are reviewed long-term benefits are discussed. All questions are answered patient agrees to proceed.  Procedure:  Viviano Bir a 51 y.o. male who was identified and appropriate procedural time out was performed.  The patient was then placed supine on the table and prepped and draped in the usual sterile  fashion.  Ultrasound was used to evaluate the right common femoral vein.  It was patent, as it was echolucent and compressible.  A digital ultrasound image was acquired for the permanent record.  A Seldinger needle was used to access the right common femoral vein under direct ultrasound guidance.  A 0.035 J wire was advanced without resistance and a 5Fr sheath was placed and then upsized to an 9 [place] sheath.    The wire and pigtail catheter were then negotiated into the right atrium and bolus injection of contrast was utilized to demonstrate the right ventricle and the pulmonary artery outflow. The wire and catheter were then negotiated into the main pulmonary artery where hand injection of contrast was utilized to demonstrate the pulmonary arteries and confirm the locations of the pulmonary emboli.The left upper lobe and left lower lobe were cannulated and selective imaging was performed. No thrombus seen in the left upper lobe.  Moderate amount of thrombus in the left lower leg. I then used the advantage wire and the JR4 catheter to go up to the right side and selectively cannulated the right lower lobe, the right middle lobe, and the right upper lobe.  No thrombus was seen in the right upper lobe.  A small amount of thrombus was seen in the right middle lobe and a moderate amount of thrombus was seen in the right lower lobe.  3000 units of heparin was then given and allowed to circulate.  TPA was reconstituted and delivered onto the table. A total of 8 milligrams of TPA was utilized.  4 mg was administered on the left side and 4 mg was administered on the right side. This was  then allowed to dwell.  The Penumbra Cat 8 catheter was then advanced up into the pulmonary vasculature. The left lung was addressed first. Catheter was negotiated into the left lower lobe and mechanical thrombectomy was performed.  Passes were made with the separator.  Follow-up imaging demonstrated a good result with decrease in  the amount of thrombus.  At this point our attention was turned to the right lung.  The Penumbra Cat 8 catheter was then negotiated to the opposite side. The right lung was then addressed. Catheter was negotiated into the right lower lobe and mechanical thrombectomy was performed.  The separator was used.  Follow-up imaging demonstrated a good result and therefore the catheter was renegotiated into the right middle lobe pulmonary artery and again mechanical thrombectomy was performed. Passes were made with both the Penumbra catheter itself as well as introducing the separator. Follow-up imaging was then performed.  Significant decrease in the thrombus burden was seen on the right as well.  After review these images wires were reintroduced and the catheters removed. Then, the sheath is then pulled and pressures held. A safeguard is placed.    Findings:   Right heart imaging:  Right atrium and right ventricle and the pulmonary outflow tract appears dilated  Right lung:  No thrombus was seen in the right upper lobe.  A small amount of thrombus was seen in the right middle lobe and a moderate amount of thrombus was seen in the right lower lobe.  Left lung: No thrombus seen in the left upper lobe.  Moderate amount of thrombus in the left lower leg.    Disposition: Patient was taken to the recovery room in stable condition having tolerated the procedure well.  [name] 03/26/2021,3:02 PM

## 2021-03-26 NOTE — Plan of Care (Signed)

## 2021-03-26 NOTE — Progress Notes (Signed)
[facility]  - [place] [facility]   PATIENT NAME: Benjamin Dyer    MR#:  [id number]  DATE OF BIRTH:  [birth year]  SUBJECTIVE:   overall doing well. Very small chest pain. No shortness of breath. Wife at bedside. NPO for procedure scheduled this afternoon   VITALS:  Blood pressure 118/77, pulse (!) 57, temperature 98.9 F (37.2 C), resp. rate 18, height 6\' 4"  (1.93 m), weight 127 kg, SpO2 97 %.  PHYSICAL EXAMINATION:   GENERAL:  51 y.o.-year-old patient lying in the bed with no acute distress.  LUNGS: Normal breath sounds bilaterally, no wheezing, rales, rhonchi.  CARDIOVASCULAR: S1, S2 normal. No murmurs, rubs, or gallops.  ABDOMEN: Soft, nontender, nondistended. Bowel sounds present.  EXTREMITIES: No  edema b/l.    NEUROLOGIC: nonfocal  patient is alert and awake SKIN: No obvious rash, lesion, or ulcer.   LABORATORY PANEL:  CBC Recent Labs  Lab 03/26/21 0303  WBC 9.7  HGB 15.7  HCT 45.4  PLT 197    Chemistries  Recent Labs  Lab 03/24/21 2304 03/26/21 0303  NA 139  --   K 3.7  --   CL 100  --   CO2 27  --   GLUCOSE 109*  --   BUN 18 16  CREATININE 1.08 0.98  CALCIUM 9.5  --   AST 21  --   ALT 28  --   ALKPHOS 78  --   BILITOT 1.1  --    Cardiac Enzymes No results for input(s): TROPONINI in the last 168 hours. RADIOLOGY:  DG Chest 2 View  Result Date: 03/24/2021 CLINICAL DATA:  Right-sided upper abdominal pain and right-sided rib pain. EXAM: CHEST - 2 VIEW COMPARISON:  November 08, 2020 FINDINGS: Mildly low lung volumes are present. Mild right basilar atelectasis and/or infiltrate is seen. A very small right pleural effusion is noted. No pneumothorax is identified. The heart size and mediastinal contours are within normal limits. The visualized skeletal structures are unremarkable. IMPRESSION: 1. Mild right basilar atelectasis and/or infiltrate. 2. Very small right pleural effusion. Electronically Signed   By: Aram Candelahaddeus  Houston M.D.   On:  03/24/2021 23:17   CT Angio Chest PE W/Cm &/Or Wo Cm  Result Date: 03/25/2021 CLINICAL DATA:  Right chest pain. EXAM: CT ANGIOGRAPHY CHEST WITH CONTRAST TECHNIQUE: Multidetector CT imaging of the chest was performed using the standard protocol during bolus administration of intravenous contrast. Multiplanar CT image reconstructions and MIPs were obtained to evaluate the vascular anatomy. RADIATION DOSE REDUCTION: This exam was performed according to the departmental dose-optimization program which includes automated exposure control, adjustment of the mA and/or kV according to patient size and/or use of iterative reconstruction technique. CONTRAST:  100mL OMNIPAQUE IOHEXOL 350 MG/ML SOLN COMPARISON:  PA and lateral chest yesterday.  No prior chest CT. FINDINGS: Cardiovascular: There is mild cardiomegaly with a slight right chamber predominance, mild degree of IVC reflux. Pulmonary arteries are normal in caliber. There are embolic filling defects in multiple right lower lobe basal subsegmental arteries and in least 2 subsegmental left lower lobe posterior basal arteries and possibly a few downstream small divisions of these arteries. There is no pericardial effusion , no visible coronary artery calcification. There are trace calcifications in the aortic annulus. The aorta is within normal caliber limits with normal great vessels. The pulmonary veins are normal caliber. There is a prominent azygous venous confluence incidentally seen. Mediastinum/Nodes: There is a slightly prominent lymph node in the inferior right hilum measuring  1 cm in short axis. There are no further intrathoracic adenopathy. Visualized thyroid gland, axillary spaces are unremarkable. The thoracic esophagus is unremarkable. The trachea is free of filling defects. Lungs/Pleura: There is a small layering right and trace left pleural effusions. There is patchy consolidation mixed with linear opacities in the right lower lobe, which could be due  to pulmonary infarcts, pneumonic infiltrates or combination. There are linear atelectatic foci in the right middle lobe and base of the right upper lobe, and in the left lower lobe in the setting of low lung volumes. The remaining lungs clear.  Central airways are clear. Upper Abdomen: No acute abnormality. Musculoskeletal: There is chronic wedging and endplate irregularities in the lower half of the thoracic spine with degenerative disc disease and scattered endplate Schmorl's nodes. Findings could be due to old trauma or childhood Scheuermann's disease. Review of the MIP images confirms the above findings. IMPRESSION: 1. Right-greater-than-left bilateral lower lobe subsegmental arterial emboli. 2. Overall small clot burden but with a least mild findings of right heart strain noted. 3. Small right pleural effusion, with opacities in the right lower lobe consistent with pneumonic infiltrates and/or infarcts. 4. Single slightly prominent right hilar lymph node possibly reactive. 5. Follow-up study recommended after treatment. 6. Results phoned to Dr. Dolores Frame at 5:58 a.m., 03/25/2021. Electronically Signed   By: Almira Bar M.D.   On: 03/25/2021 06:05   US Venous Img Lower Unilateral Right (DVT)  Result Date: 03/25/2021 CLINICAL DATA:  51 year old male with history of right lower extremity pain. EXAM: RIGHT LOWER EXTREMITY VENOUS DOPPLER ULTRASOUND TECHNIQUE: Gray-scale sonography with compression, as well as color and duplex ultrasound, were performed to evaluate the deep venous system(s) from the level of the common femoral vein through the popliteal and proximal calf veins. COMPARISON:  No priors. FINDINGS: VENOUS Normal compressibility of the common femoral, and superficial femoral veins, as well as the visualized deep calf veins. However, there was some echogenic material within the popliteal vein which is compatible with thrombus, which appears nonocclusive at this time. There also appears to be some  superficial thrombus in a gastrocnemius vein in the right calf region. Visualized portions of profunda femoral vein and great saphenous vein unremarkable. No other filling defects to suggest DVT on grayscale or color Doppler imaging. Doppler waveforms show normal direction of venous flow, normal respiratory plasticity and response to augmentation. Limited views of the contralateral common femoral vein are unremarkable. OTHER None. Limitations: none IMPRESSION: 1. Study is positive for nonocclusive deep venous thrombosis in the right popliteal vein. Additional thrombus is noted in some superficial veins in the region of the right gastrocnemius. Negative. Electronically Signed   By: Trudie Reed M.D.   On: 03/25/2021 06:39   ECHOCARDIOGRAM COMPLETE  Result Date: 03/25/2021    ECHOCARDIOGRAM REPORT   Patient Name:   SUREN PAYNE Date of Exam: 03/25/2021 Medical Rec #:  [id number]           Height:       76.0 in Accession #:    [id number]          Weight:       280.0 lb Date of Birth:  1970/02/10           BSA:          2.556 m Patient Age:    50 years            BP:           118/62 mmHg Patient  Gender: M                   HR:           57 bpm. Exam Location:  [facility] Procedure: 2D Echo, Cardiac Doppler, Color Doppler and Saline Contrast Bubble            Study Indications:     Pulmonary Embolus I26.09  History:         Patient has no prior history of Echocardiogram examinations. No                  past medical history on file.  Sonographer:     Cristela BlueJerry Hege Referring Phys:  [id number] Zai Chmiel Diagnosing Phys: Yvonne Kendallhristopher End MD IMPRESSIONS  1. Left ventricular ejection fraction, by estimation, is 55 to 60%. The left ventricle has normal function. Left ventricular endocardial border not optimally defined to evaluate regional wall motion. Left ventricular diastolic parameters were normal.  2. Right ventricular systolic function is normal. The right ventricular size is normal.  3. Left atrial size was mild to  moderately dilated.  4. Right atrial size was mild to moderately dilated.  5. The mitral valve is grossly normal. Trivial mitral valve regurgitation. No evidence of mitral stenosis.  6. The aortic valve has an indeterminant number of cusps. Aortic valve regurgitation is not visualized. No aortic stenosis is present.  7. Agitated saline contrast bubble study was negative, with no evidence of any interatrial shunt. FINDINGS  Left Ventricle: Left ventricular ejection fraction, by estimation, is 55 to 60%. The left ventricle has normal function. Left ventricular endocardial border not optimally defined to evaluate regional wall motion. The left ventricular internal cavity size was normal in size. There is no left ventricular hypertrophy. Left ventricular diastolic parameters were normal. Right Ventricle: The right ventricular size is normal. No increase in right ventricular wall thickness. Right ventricular systolic function is normal. Left Atrium: Left atrial size was mild to moderately dilated. Right Atrium: Right atrial size was mild to moderately dilated. Pericardium: There is no evidence of pericardial effusion. Mitral Valve: The mitral valve is grossly normal. Trivial mitral valve regurgitation. No evidence of mitral valve stenosis. MV peak gradient, 3.1 mmHg. The mean mitral valve gradient is 1.0 mmHg. Tricuspid Valve: The tricuspid valve is normal in structure. Tricuspid valve regurgitation is trivial. Aortic Valve: The aortic valve has an indeterminant number of cusps. Aortic valve regurgitation is not visualized. No aortic stenosis is present. Aortic valve mean gradient measures 4.5 mmHg. Aortic valve peak gradient measures 7.8 mmHg. Aortic valve area, by VTI measures 2.68 cm. Pulmonic Valve: The pulmonic valve was not well visualized. Pulmonic valve regurgitation is trivial. No evidence of pulmonic stenosis. Aorta: The aortic root is normal in size and structure. Pulmonary Artery: The pulmonary artery is not  well seen. Venous: The inferior vena cava was not well visualized. IAS/Shunts: The interatrial septum was not well visualized. Agitated saline contrast was given intravenously to evaluate for intracardiac shunting. Agitated saline contrast bubble study was negative, with no evidence of any interatrial shunt.  LEFT VENTRICLE PLAX 2D LVIDd:         4.50 cm   Diastology LVIDs:         3.00 cm   LV e' medial:    9.46 cm/s LV PW:         1.05 cm   LV E/e' medial:  7.3 LV IVS:        0.93 cm  LV e' lateral:   7.18 cm/s LVOT diam:     2.00 cm   LV E/e' lateral: 9.7 LV SV:         76 LV SV Index:   30 LVOT Area:     3.14 cm  RIGHT VENTRICLE RV Basal diam:  3.10 cm RV S prime:     14.90 cm/s TAPSE (M-mode): 3.9 cm LEFT ATRIUM              Index        RIGHT ATRIUM           Index LA diam:        3.80 cm  1.49 cm/m   RA Area:     26.80 cm LA Vol (A2C):   71.4 ml  27.94 ml/m  RA Volume:   84.70 ml  33.14 ml/m LA Vol (A4C):   106.0 ml 41.47 ml/m LA Biplane Vol: 90.6 ml  35.45 ml/m  AORTIC VALVE                    PULMONIC VALVE AV Area (Vmax):    2.47 cm     PV Vmax:        0.91 m/s AV Area (Vmean):   2.39 cm     PV Vmean:       60.500 cm/s AV Area (VTI):     2.68 cm     PV VTI:         0.201 m AV Vmax:           139.50 cm/s  PV Peak grad:   3.3 mmHg AV Vmean:          98.000 cm/s  PV Mean grad:   2.0 mmHg AV VTI:            0.285 m      RVOT Peak grad: 7 mmHg AV Peak Grad:      7.8 mmHg AV Mean Grad:      4.5 mmHg LVOT Vmax:         109.50 cm/s LVOT Vmean:        74.650 cm/s LVOT VTI:          0.242 m LVOT/AV VTI ratio: 0.85  AORTA Ao Root diam: 3.03 cm MITRAL VALVE MV Area (PHT): 2.99 cm    SHUNTS MV Area VTI:   2.81 cm    Systemic VTI:  0.24 m MV Peak grad:  3.1 mmHg    Systemic Diam: 2.00 cm MV Mean grad:  1.0 mmHg    Pulmonic VTI:  0.216 m MV Vmax:       0.88 m/s MV Vmean:      42.0 cm/s MV Decel Time: 254 msec MV E velocity: 69.40 cm/s MV A velocity: 58.70 cm/s MV E/A ratio:  1.18 Cristal Deer End MD  Electronically signed by Yvonne Kendall MD Signature Date/Time: 03/25/2021/7:35:24 PM    Final    US Abdomen Limited RUQ (LIVER/GB)  Result Date: 03/25/2021 CLINICAL DATA:  Right upper quadrant pain. EXAM: ULTRASOUND ABDOMEN LIMITED RIGHT UPPER QUADRANT COMPARISON:  None. FINDINGS: Gallbladder: The gallbladder is mildly contracted. No gallstones or wall thickening visualized (2.8 mm). No sonographic Murphy sign noted by sonographer. Common bile duct: Diameter: 3.3 mm Liver: No focal lesion identified. Within normal limits in parenchymal echogenicity. Portal vein is patent on color Doppler imaging with normal direction of blood flow towards the liver. Other: A trace amount of pleural fluid is suspected on the right.  IMPRESSION: 1. Trace right pleural effusion. 2. Otherwise, unremarkable right upper quadrant ultrasound. Electronically Signed   By: Aram Candela M.D.   On: 03/25/2021 00:32    Assessment and Plan    Tylek Boney is a 51 y.o. male with no medical history comes to the emergency room with complaints of right lower chest pain radiating to shoulder for one day. Patient describes pain as sharp and increases with respiration. Denies associated diaphoresis palpitations nausea vomiting. Patient also complained of right lower extremity pain past week.    Acute bilateral pulmonary embolism Right lower extremity DVT popliteal vein -- hypercoaguable bloodwork sent -- vascular surgery Dr. Wyn Quaker to see patient if he is a candidate for thrombectomy -- patient hemodynamically stable -- sats 98% on room air -- continue IV heparin drip. Will add oral anticoagulation likely tomorrow. -- Patient will follow-up outpatient. Dr. Smith Robert aware and will get appointment for patient. -- Echo of the heart-- EF 55 to 60%. Left ventricular systolic function normal. Right ventricular systolic function normal. Mild increase RA and LA size   DVT prophylaxis -- on full anticoagulation        Procedures: Family communication : wife in the room Consults : vascular Dr. Wyn Quaker CODE STATUS: full DVT Prophylaxis : heparin drip Level of care: Telemetry Medical Status is: Inpatient Remains inpatient appropriate because: bilateral pulmonary embolism. Patient is supposed to go for Mech thrombectomy.     TOTAL TIME TAKING CARE OF THIS PATIENT: 25 minutes.  >50% time spent on counselling and coordination of care  Note: This dictation was prepared with Dragon dictation along with smaller phrase technology. Any transcriptional errors that result from this process are unintentional.  Enedina Finner M.D    [facility]   CC: Primary care physician; Glori Luis, MD

## 2021-03-26 NOTE — Progress Notes (Signed)
ANTICOAGULATION CONSULT NOTE   Pharmacy Consult for heparin infusion Indication: pulmonary embolus  No Known Allergies  Patient Measurements: Height: 6\' 4"  (193 cm) Weight: 127 kg (280 lb) IBW/kg (Calculated) : 86.8 Heparin Dosing Weight: 114.1 kg  Vital Signs: Temp: 98.6 F (37 C) (02/22 0231) BP: 124/74 (02/22 0231) Pulse Rate: 53 (02/22 0231)  Labs:   Estimated Creatinine Clearance: 134.2 mL/min (by C-G formula based on SCr of 0.98 mg/dL). Heparin Dosing Weight: 114.1 kg  Medical History: History reviewed. No pertinent past medical history.  Assessment: Pt is 51 yo male presenting to ED with SOB & upper right abd pain being started on heparin for PE and DVT. 52  lower extremity shows noninclusive DVT right popliteal vein. Additional thrombus noted in superficial vein in the region of right gastrocnemius. Starting hypercoag w/u. Pharmacy consulted for mgmt of heparin gtt.  2/21 - Noted, ATIII activity 102% appropriate. Baseline labs WNL.   Date Time  HL Rate/Comment 0221 1400 0.50  Therapeutic x1; 1850 un/hr   0221 1959 0.43 Therapeutic x2; 1850 un/hr 2/22 0303 0.41 Therapeutic x 3  Baseline Labs: aPTT - 28s INR - 1.1 Hgb - 16.3 Plts - 230  Goal of Therapy:  Heparin level 0.3-0.7 units/ml Monitor platelets by anticoagulation protocol: Yes   Plan:  Heparin level consecutively therapeutic.  **Pending ACL Ab's, Prothrombin gene, FVL, Homocystein, B2G Ab, protein C/S, Lupus AC. Continue heparin infusion at 1850 units/hr Check daily heparin levels with AM labs and CTM CBC daily while on heparin  3/22, PharmD, [facility] 03/26/2021 5:42 AM

## 2021-03-27 ENCOUNTER — Encounter: Payer: Self-pay | Admitting: Vascular Surgery

## 2021-03-27 DIAGNOSIS — I2699 Other pulmonary embolism without acute cor pulmonale: Secondary | ICD-10-CM | POA: Diagnosis not present

## 2021-03-27 LAB — BETA-2-GLYCOPROTEIN I ABS, IGG/M/A
Beta-2 Glyco I IgG: <9 GPI IgG units (ref 0–20)
Beta-2-Glycoprotein I IgA: <9 GPI IgA units (ref 0–25)
Beta-2-Glycoprotein I IgM: <9 GPI IgM units (ref 0–32)

## 2021-03-27 LAB — HEPARIN LEVEL (UNFRACTIONATED): Heparin Unfractionated: 0.3 IU/mL (ref 0.30–0.70)

## 2021-03-27 LAB — PROTEIN C, TOTAL: Protein C, Total: 83 % (ref 60–150)

## 2021-03-27 MED ORDER — APIXABAN 5 MG PO TABS: 10.0000 mg | ORAL_TABLET | Freq: Two times a day (BID) | ORAL | 3 refills | Status: DC

## 2021-03-27 MED ORDER — APIXABAN 5 MG PO TABS: 5.0000 mg | ORAL_TABLET | Freq: Two times a day (BID) | ORAL | Status: DC

## 2021-03-27 MED ORDER — APIXABAN 5 MG PO TABS
10.0000 mg | ORAL_TABLET | Freq: Two times a day (BID) | ORAL | Status: DC
  Administered 2021-03-27: 10 mg via ORAL
  Filled 2021-03-27: qty 2

## 2021-03-27 NOTE — Plan of Care (Signed)
°  Problem: Education: Goal: Knowledge of General Education information will improve Description: Including pain rating scale, medication(s)/side effects and non-pharmacologic comfort measures Outcome: Progressing   Problem: Health Behavior/Discharge Planning: Goal: Ability to manage health-related needs will improve Outcome: Progressing   Problem: Clinical Measurements: Goal: Ability to maintain clinical measurements within normal limits will improve 03/27/2021 1004 by Leonie Douglas, RN Outcome: Progressing 03/27/2021 1003 by Leonie Douglas, RN Outcome: Progressing Goal: Will remain free from infection Outcome: Progressing Goal: Diagnostic test results will improve Outcome: Progressing Goal: Respiratory complications will improve Outcome: Progressing   Problem: Activity: Goal: Risk for activity intolerance will decrease Outcome: Progressing   Problem: Nutrition: Goal: Adequate nutrition will be maintained Outcome: Progressing   Problem: Consults Goal: Venous Thromboembolism Patient Education Description: See Patient Education Module for education specifics. Outcome: Progressing Goal: Diagnosis - Venous Thromboembolism (VTE) Description: Choose a selection Outcome: Progressing Goal: Pharmacy Consult for anticoagulation Outcome: Progressing Goal: Skin Care Protocol Initiated - if Braden Score 18 or less Description: If consults are not indicated, leave blank or document N/A Outcome: Progressing Goal: Nutrition Consult-if indicated Outcome: Progressing Goal: Diabetes Guidelines if Diabetic/Glucose > 140 Description: If diabetic or lab glucose is > 140 mg/dl - Initiate Diabetes/Hyperglycemia Guidelines & Document Interventions  Outcome: Progressing   Problem: Phase I Progression Outcomes Goal: Pain controlled with appropriate interventions Outcome: Progressing Goal: Dyspnea controlled at rest (PE) Outcome: Progressing Goal: Tolerating diet Outcome:  Progressing Goal: Initial discharge plan identified Outcome: Progressing Goal: Voiding-avoid urinary catheter unless indicated Outcome: Progressing Goal: Hemodynamically stable Outcome: Progressing Goal: Other Phase I Outcomes/Goals Outcome: Progressing   Problem: Phase II Progression Outcomes Goal: Therapeutic drug levels for anticoagulation Outcome: Progressing Goal: 02 sats trending upward/stable (PE) Outcome: Progressing Goal: Discharge plan established Outcome: Progressing Goal: Tolerating diet Outcome: Progressing Goal: Other Phase II Outcomes/Goals Outcome: Progressing   Problem: Phase III Progression Outcomes Goal: 02 sats stabilized Outcome: Progressing Goal: Activity at appropriate level-compared to baseline Description: (UP IN CHAIR FOR HEMODIALYSIS) Outcome: Progressing Goal: Discharge plan remains appropriate-arrangements made Outcome: Progressing Goal: Other Phase III Outcomes/Goals Outcome: Progressing   Problem: Discharge Progression Outcomes Goal: Barriers To Progression Addressed/Resolved Outcome: Progressing Goal: Discharge plan in place and appropriate Outcome: Progressing Goal: Pain controlled with appropriate interventions Outcome: Progressing Goal: Hemodynamically stable Outcome: Progressing Goal: Complications resolved/controlled Outcome: Progressing Goal: Tolerating diet Outcome: Progressing Goal: Activity appropriate for discharge plan Outcome: Progressing Goal: Other Discharge Outcomes/Goals Outcome: Progressing

## 2021-03-27 NOTE — Progress Notes (Signed)
This RN assisted patient to the bathroom. After he finished urinating he sat on the toilet because he felt dizzy. Pt appeared diaphoretic and pale. Blood pressure 75/53, pulse 41. Pt was safely returned to bed via wheelchair. BP 112/69, pulse 52 in bed. On tele monitor 4.16 second pause noted. Denies pain. Bishop Limbo, NP made aware. No new orders at this time.     03/27/21 0026  Vitals  BP (!) 75/53  MAP (mmHg) (!) 62  BP Method Automatic  Pulse Rate (!) 41  Pulse Rate Source Monitor  MEWS COLOR  MEWS Score Color Red  Oxygen Therapy  SpO2 95 %  MEWS Score  MEWS Temp 0  MEWS Systolic 2  MEWS Pulse 1  MEWS RR 1  MEWS LOC 0  MEWS Score 4

## 2021-03-27 NOTE — Discharge Summary (Signed)
Physician Discharge Summary   Patient: Benjamin Dyer MRN: [id number] DOB: [birth year]  Admit date:     03/25/2021  Discharge date: 03/27/21  Discharge Physician: Enedina Finner   PCP: Glori Luis, MD   Recommendations at discharge:   follow-up Dr. Smith Robert at the cancer center in 2 to 3 weeks. Follow-up primary care and 1 to 2 weeks follow-up Dr. Wyn Quaker in two weeks  Discharge Diagnoses: bilateral pulmonary embolism right popliteal vein DVT-- unprovoked  Hospital Course:   Benjamin Dyer is a 51 y.o. male with no medical history comes to the emergency room with complaints of right lower chest pain radiating to shoulder for one day. Patient describes pain as sharp and increases with respiration. Denies associated diaphoresis palpitations nausea vomiting. Patient also complained of right lower extremity pain past week.    Acute bilateral pulmonary embolism Right lower extremity DVT popliteal vein -- hypercoaguable bloodwork sent-- patient will follow-up with Dr. Smith Robert for results -- vascular surgery Dr. Wyn Quaker to see patient if he is a candidate for thrombectomy -- patient hemodynamically stable -- sats 98% on room air -- continue IV heparin drip. Will add oral anticoagulation likely tomorrow. -- Patient will follow-up outpatient. Dr. Smith Robert aware and will get appointment for patient. -- Echo of the heart-- EF 55 to 60%. Left ventricular systolic function normal. Right ventricular systolic function normal. Mild increase RA and LA size --s/p Mechanical thrombectomy bilateral Lower lobes. -- Will switch to oral eliquis. Patient will follow-up with PCP, Dr. Wyn Quaker, Dr. Odis Luster outpatient   DVT prophylaxis -- on full anticoagulation   overall improving. Discussed discharge plan with patient and wife in the room. All questions answered to my best ability.        Consultants: vascular identity Procedures performed: Mechanical Thrombectomy bilateral Lower pulmonary Lob es Disposition:  Home Diet recommendation:  Discharge Diet Orders (From admission, onward)     Start     Ordered   03/27/21 0000  Diet general        03/27/21 0904           Regular diet  DISCHARGE MEDICATION: Allergies as of 03/27/2021   No Known Allergies      Medication List     TAKE these medications    apixaban 5 MG Tabs tablet Commonly known as: ELIQUIS Take 2 tablets (10 mg total) by mouth 2 (two) times daily. And from 04/03/2021 start taking 5 mg (1 tab) two times a day (twice)   multivitamin tablet Take 1 tablet by mouth daily.        Follow-up Information     Glori Luis, MD. Schedule an appointment as soon as possible for a visit in 1 week(s).   Specialty: Family Medicine Why: PE/DVT Contact information: [address] [place] [address] [phone]                 Discharge Exam: Ceasar Mons Weights   03/24/21 2253  Weight: 127 kg     Condition at discharge: fair  The results of significant diagnostics from this hospitalization (including imaging, microbiology, ancillary and laboratory) are listed below for reference.   Imaging Studies: DG Chest 2 View  Result Date: 03/24/2021 CLINICAL DATA:  Right-sided upper abdominal pain and right-sided rib pain. EXAM: CHEST - 2 VIEW COMPARISON:  November 08, 2020 FINDINGS: Mildly low lung volumes are present. Mild right basilar atelectasis and/or infiltrate is seen. A very small right pleural effusion is noted. No pneumothorax is identified. The  heart size and mediastinal contours are within normal limits. The visualized skeletal structures are unremarkable. IMPRESSION: 1. Mild right basilar atelectasis and/or infiltrate. 2. Very small right pleural effusion. Electronically Signed   By: Aram Candela M.D.   On: 03/24/2021 23:17   CT Angio Chest PE W/Cm &/Or Wo Cm  Result Date: 03/25/2021 CLINICAL DATA:  Right chest pain. EXAM: CT ANGIOGRAPHY CHEST WITH CONTRAST TECHNIQUE: Multidetector CT  imaging of the chest was performed using the standard protocol during bolus administration of intravenous contrast. Multiplanar CT image reconstructions and MIPs were obtained to evaluate the vascular anatomy. RADIATION DOSE REDUCTION: This exam was performed according to the departmental dose-optimization program which includes automated exposure control, adjustment of the mA and/or kV according to patient size and/or use of iterative reconstruction technique. CONTRAST:  OMNIPAQUE IOHEXOL 350 MG/ML SOLN COMPARISON:  PA and lateral chest yesterday.  No prior chest CT. FINDINGS: Cardiovascular: There is mild cardiomegaly with a slight right chamber predominance, mild degree of IVC reflux. Pulmonary arteries are normal in caliber. There are embolic filling defects in multiple right lower lobe basal subsegmental arteries and in least 2 subsegmental left lower lobe posterior basal arteries and possibly a few downstream small divisions of these arteries. There is no pericardial effusion , no visible coronary artery calcification. There are trace calcifications in the aortic annulus. The aorta is within normal caliber limits with normal great vessels. The pulmonary veins are normal caliber. There is a prominent azygous venous confluence incidentally seen. Mediastinum/Nodes: There is a slightly prominent lymph node in the inferior right hilum measuring 1 cm in short axis. There are no further intrathoracic adenopathy. Visualized thyroid gland, axillary spaces are unremarkable. The thoracic esophagus is unremarkable. The trachea is free of filling defects. Lungs/Pleura: There is a small layering right and trace left pleural effusions. There is patchy consolidation mixed with linear opacities in the right lower lobe, which could be due to pulmonary infarcts, pneumonic infiltrates or combination. There are linear atelectatic foci in the right middle lobe and base of the right upper lobe, and in the left lower lobe in  the setting of low lung volumes. The remaining lungs clear.  Central airways are clear. Upper Abdomen: No acute abnormality. Musculoskeletal: There is chronic wedging and endplate irregularities in the lower half of the thoracic spine with degenerative disc disease and scattered endplate Schmorl's nodes. Findings could be due to old trauma or childhood Scheuermann's disease. Review of the MIP images confirms the above findings. IMPRESSION: 1. Right-greater-than-left bilateral lower lobe subsegmental arterial emboli. 2. Overall small clot burden but with a least mild findings of right heart strain noted. 3. Small right pleural effusion, with opacities in the right lower lobe consistent with pneumonic infiltrates and/or infarcts. 4. Single slightly prominent right hilar lymph node possibly reactive. 5. Follow-up study recommended after treatment. 6. Results phoned to Dr. Dolores Frame at 5:58 a.m., 03/25/2021. Electronically Signed   By: Almira Bar M.D.   On: 03/25/2021 06:05   PERIPHERAL VASCULAR CATHETERIZATION  Result Date: 03/26/2021 See surgical note for result.  US Venous Img Lower Unilateral Right (DVT)  Result Date: 03/25/2021 CLINICAL DATA:  51 year old male with history of right lower extremity pain. EXAM: RIGHT LOWER EXTREMITY VENOUS DOPPLER ULTRASOUND TECHNIQUE: Gray-scale sonography with compression, as well as color and duplex ultrasound, were performed to evaluate the deep venous system(s) from the level of the common femoral vein through the popliteal and proximal calf veins. COMPARISON:  No priors. FINDINGS: VENOUS Normal compressibility of  the common femoral, and superficial femoral veins, as well as the visualized deep calf veins. However, there was some echogenic material within the popliteal vein which is compatible with thrombus, which appears nonocclusive at this time. There also appears to be some superficial thrombus in a gastrocnemius vein in the right calf region. Visualized portions of  profunda femoral vein and great saphenous vein unremarkable. No other filling defects to suggest DVT on grayscale or color Doppler imaging. Doppler waveforms show normal direction of venous flow, normal respiratory plasticity and response to augmentation. Limited views of the contralateral common femoral vein are unremarkable. OTHER None. Limitations: none IMPRESSION: 1. Study is positive for nonocclusive deep venous thrombosis in the right popliteal vein. Additional thrombus is noted in some superficial veins in the region of the right gastrocnemius. Negative. Electronically Signed   By: Trudie Reed M.D.   On: 03/25/2021 06:39   ECHOCARDIOGRAM COMPLETE  Result Date: 03/25/2021    ECHOCARDIOGRAM REPORT   Patient Name:   SUTTER AHLGREN Date of Exam: 03/25/2021 Medical Rec #:  [id number]           Height:       76.0 in Accession #:    [id number]          Weight:       280.0 lb Date of Birth:  1971-01-10           BSA:          2.556 m Patient Age:    50 years            BP:           118/62 mmHg Patient Gender: M                   HR:           57 bpm. Exam Location:  [facility] Procedure: 2D Echo, Cardiac Doppler, Color Doppler and Saline Contrast Bubble            Study Indications:     Pulmonary Embolus I26.09  History:         Patient has no prior history of Echocardiogram examinations. No                  past medical history on file.  Sonographer:     Cristela Blue Referring Phys:  [id number] Shiffler Diagnosing Phys: Yvonne Kendall MD IMPRESSIONS  1. Left ventricular ejection fraction, by estimation, is 55 to 60%. The left ventricle has normal function. Left ventricular endocardial border not optimally defined to evaluate regional wall motion. Left ventricular diastolic parameters were normal.  2. Right ventricular systolic function is normal. The right ventricular size is normal.  3. Left atrial size was mild to moderately dilated.  4. Right atrial size was mild to moderately dilated.  5. The mitral valve is  grossly normal. Trivial mitral valve regurgitation. No evidence of mitral stenosis.  6. The aortic valve has an indeterminant number of cusps. Aortic valve regurgitation is not visualized. No aortic stenosis is present.  7. Agitated saline contrast bubble study was negative, with no evidence of any interatrial shunt. FINDINGS  Left Ventricle: Left ventricular ejection fraction, by estimation, is 55 to 60%. The left ventricle has normal function. Left ventricular endocardial border not optimally defined to evaluate regional wall motion. The left ventricular internal cavity size was normal in size. There is no left ventricular hypertrophy. Left ventricular diastolic parameters were normal. Right Ventricle: The right ventricular size  is normal. No increase in right ventricular wall thickness. Right ventricular systolic function is normal. Left Atrium: Left atrial size was mild to moderately dilated. Right Atrium: Right atrial size was mild to moderately dilated. Pericardium: There is no evidence of pericardial effusion. Mitral Valve: The mitral valve is grossly normal. Trivial mitral valve regurgitation. No evidence of mitral valve stenosis. MV peak gradient, 3.1 mmHg. The mean mitral valve gradient is 1.0 mmHg. Tricuspid Valve: The tricuspid valve is normal in structure. Tricuspid valve regurgitation is trivial. Aortic Valve: The aortic valve has an indeterminant number of cusps. Aortic valve regurgitation is not visualized. No aortic stenosis is present. Aortic valve mean gradient measures 4.5 mmHg. Aortic valve peak gradient measures 7.8 mmHg. Aortic valve area, by VTI measures 2.68 cm. Pulmonic Valve: The pulmonic valve was not well visualized. Pulmonic valve regurgitation is trivial. No evidence of pulmonic stenosis. Aorta: The aortic root is normal in size and structure. Pulmonary Artery: The pulmonary artery is not well seen. Venous: The inferior vena cava was not well visualized. IAS/Shunts: The interatrial  septum was not well visualized. Agitated saline contrast was given intravenously to evaluate for intracardiac shunting. Agitated saline contrast bubble study was negative, with no evidence of any interatrial shunt.  LEFT VENTRICLE PLAX 2D LVIDd:         4.50 cm   Diastology LVIDs:         3.00 cm   LV e' medial:    9.46 cm/s LV PW:         1.05 cm   LV E/e' medial:  7.3 LV IVS:        0.93 cm   LV e' lateral:   7.18 cm/s LVOT diam:     2.00 cm   LV E/e' lateral: 9.7 LV SV:         76 LV SV Index:   30 LVOT Area:     3.14 cm  RIGHT VENTRICLE RV Basal diam:  3.10 cm RV S prime:     14.90 cm/s TAPSE (M-mode): 3.9 cm LEFT ATRIUM              Index        RIGHT ATRIUM           Index LA diam:        3.80 cm  1.49 cm/m   RA Area:     26.80 cm LA Vol (A2C):   71.4 ml  27.94 ml/m  RA Volume:   84.70 ml  33.14 ml/m LA Vol (A4C):   106.0 ml 41.47 ml/m LA Biplane Vol: 90.6 ml  35.45 ml/m  AORTIC VALVE                    PULMONIC VALVE AV Area (Vmax):    2.47 cm     PV Vmax:        0.91 m/s AV Area (Vmean):   2.39 cm     PV Vmean:       60.500 cm/s AV Area (VTI):     2.68 cm     PV VTI:         0.201 m AV Vmax:           139.50 cm/s  PV Peak grad:   3.3 mmHg AV Vmean:          98.000 cm/s  PV Mean grad:   2.0 mmHg AV VTI:            0.285  m      RVOT Peak grad: 7 mmHg AV Peak Grad:      7.8 mmHg AV Mean Grad:      4.5 mmHg LVOT Vmax:         109.50 cm/s LVOT Vmean:        74.650 cm/s LVOT VTI:          0.242 m LVOT/AV VTI ratio: 0.85  AORTA Ao Root diam: 3.03 cm MITRAL VALVE MV Area (PHT): 2.99 cm    SHUNTS MV Area VTI:   2.81 cm    Systemic VTI:  0.24 m MV Peak grad:  3.1 mmHg    Systemic Diam: 2.00 cm MV Mean grad:  1.0 mmHg    Pulmonic VTI:  0.216 m MV Vmax:       0.88 m/s MV Vmean:      42.0 cm/s MV Decel Time: 254 msec MV E velocity: 69.40 cm/s MV A velocity: 58.70 cm/s MV E/A ratio:  1.18 Cristal Deerhristopher End MD Electronically signed by Yvonne Kendallhristopher End MD Signature Date/Time: 03/25/2021/7:35:24 PM    Final    US  Abdomen Limited RUQ (LIVER/GB)  Result Date: 03/25/2021 CLINICAL DATA:  Right upper quadrant pain. EXAM: ULTRASOUND ABDOMEN LIMITED RIGHT UPPER QUADRANT COMPARISON:  None. FINDINGS: Gallbladder: The gallbladder is mildly contracted. No gallstones or wall thickening visualized (2.8 mm). No sonographic Murphy sign noted by sonographer. Common bile duct: Diameter: 3.3 mm Liver: No focal lesion identified. Within normal limits in parenchymal echogenicity. Portal vein is patent on color Doppler imaging with normal direction of blood flow towards the liver. Other: A trace amount of pleural fluid is suspected on the right. IMPRESSION: 1. Trace right pleural effusion. 2. Otherwise, unremarkable right upper quadrant ultrasound. Electronically Signed   By: Aram Candelahaddeus  Houston M.D.   On: 03/25/2021 00:32    Microbiology: Results for orders placed or performed during the hospital encounter of 03/25/21  Resp Panel by RT-PCR (Flu A&B, Covid) Nasopharyngeal Swab     Status: None   Collection Time: 03/24/21 11:04 PM   Specimen: Nasopharyngeal Swab; Nasopharyngeal(NP) swabs in vial transport medium  Result Value Ref Range Status   SARS Coronavirus 2 by RT PCR NEGATIVE NEGATIVE Final    Comment: (NOTE) SARS-CoV-2 target nucleic acids are NOT DETECTED.  The SARS-CoV-2 RNA is generally detectable in upper respiratory specimens during the acute phase of infection. The lowest concentration of SARS-CoV-2 viral copies this assay can detect is 138 copies/mL. A negative result does not preclude SARS-Cov-2 infection and should not be used as the sole basis for treatment or other patient management decisions. A negative result may occur with  improper specimen collection/handling, submission of specimen other than nasopharyngeal swab, presence of viral mutation(s) within the areas targeted by this assay, and inadequate number of viral copies(<138 copies/mL). A negative result must be combined with clinical observations,  patient history, and epidemiological information. The expected result is Negative.  Fact Sheet for Patients:  BloggerCourse.comhttps://www.fda.gov/media/152166/download  Fact Sheet for Healthcare Providers:  SeriousBroker.ithttps://www.fda.gov/media/152162/download  This test is no t yet approved or cleared by the [place] FDA and  has been authorized for detection and/or diagnosis of SARS-CoV-2 by FDA under an Emergency Use Authorization (EUA). This EUA will remain  in effect (meaning this test can be used) for the duration of the COVID-19 declaration under Section 564(b)(1) of the Act, 21 U.S.C.section 360bbb-3(b)(1), unless the authorization is terminated  or revoked sooner.       Influenza A by PCR NEGATIVE NEGATIVE  Final   Influenza B by PCR NEGATIVE NEGATIVE Final    Comment: (NOTE) The Xpert Xpress SARS-CoV-2/FLU/RSV plus assay is intended as an aid in the diagnosis of influenza from Nasopharyngeal swab specimens and should not be used as a sole basis for treatment. Nasal washings and aspirates are unacceptable for Xpert Xpress SARS-CoV-2/FLU/RSV testing.  Fact Sheet for Patients: BloggerCourse.com  Fact Sheet for Healthcare Providers: SeriousBroker.it  This test is not yet approved or cleared by the [place] FDA and has been authorized for detection and/or diagnosis of SARS-CoV-2 by FDA under an Emergency Use Authorization (EUA). This EUA will remain in effect (meaning this test can be used) for the duration of the COVID-19 declaration under Section 564(b)(1) of the Act, 21 U.S.C. section 360bbb-3(b)(1), unless the authorization is terminated or revoked.  Performed at [facility], [address] [place], [place] [address]   Culture, blood (routine x 2)     Status: None (Preliminary result)   Collection Time: 03/25/21  7:07 AM   Specimen: BLOOD  Result Value Ref Range Status   Specimen Description BLOOD BLOOD LEFT HAND   Final   Special Requests   Final    BOTTLES DRAWN AEROBIC AND ANAEROBIC Blood Culture results may not be optimal due to an excessive volume of blood received in culture bottles   Culture   Final    NO GROWTH 2 DAYS Performed at [facility], [address] [place], [place] [address]    Report Status PENDING  Incomplete    Labs: CBC: Recent Labs  Lab 03/24/21 2304 03/26/21 0303  WBC 10.2 9.7  NEUTROABS 6.5  --   HGB 16.3 15.7  HCT 47.3 45.4  MCV 87.6 86.3  PLT 230 197   Basic Metabolic Panel: Recent Labs  Lab 03/24/21 2304 03/26/21 0303  NA 139  --   K 3.7  --   CL 100  --   CO2 27  --   GLUCOSE 109*  --   BUN 18 16  CREATININE 1.08 0.98  CALCIUM 9.5  --    Liver Function Tests: Recent Labs  Lab 03/24/21 2304  AST 21  ALT 28  ALKPHOS 78  BILITOT 1.1  PROT 7.7  ALBUMIN 4.3   CBG: No results for input(s): GLUCAP in the last 168 hours.  Discharge time spent: greater than 30 minutes.  Signed: Enedina Finner, MD [facility] 03/27/2021

## 2021-03-27 NOTE — Plan of Care (Signed)

## 2021-03-27 NOTE — Progress Notes (Signed)
Discharge instructions reviewed with pt and spouse. They both verbalized understanding of discharge instructions.

## 2021-03-28 LAB — PTT-LA MIX: PTT-LA Mix: 46.5 s — ABNORMAL HIGH (ref 0.0–40.5)

## 2021-03-28 LAB — PROTEIN S ACTIVITY: Protein S Activity: 90 % (ref 63–140)

## 2021-03-28 LAB — PROTEIN C ACTIVITY: Protein C Activity: 105 % (ref 73–180)

## 2021-03-28 LAB — LUPUS ANTICOAGULANT PANEL
DRVVT: 37.2 s (ref 0.0–47.0)
PTT Lupus Anticoagulant: 46.1 s — ABNORMAL HIGH (ref 0.0–43.5)

## 2021-03-28 LAB — PROTEIN S, TOTAL: Protein S Ag, Total: 109 % (ref 60–150)

## 2021-03-28 LAB — HEXAGONAL PHASE PHOSPHOLIPID: Hexagonal Phase Phospholipid: 2 s (ref 0–11)

## 2021-03-30 LAB — CULTURE, BLOOD (ROUTINE X 2): Culture: NO GROWTH

## 2021-03-31 ENCOUNTER — Other Ambulatory Visit: Payer: Self-pay

## 2021-03-31 ENCOUNTER — Ambulatory Visit: Payer: BC Managed Care – PPO | Admitting: Family Medicine

## 2021-03-31 ENCOUNTER — Encounter: Payer: Self-pay | Admitting: Family Medicine

## 2021-03-31 VITALS — BP 120/70 | HR 54 | Temp 98.6°F | Ht 76.0 in | Wt 288.0 lb

## 2021-03-31 DIAGNOSIS — I2699 Other pulmonary embolism without acute cor pulmonale: Secondary | ICD-10-CM

## 2021-03-31 DIAGNOSIS — Z125 Encounter for screening for malignant neoplasm of prostate: Secondary | ICD-10-CM

## 2021-03-31 LAB — PROTHROMBIN GENE MUTATION

## 2021-03-31 LAB — FACTOR 5 LEIDEN

## 2021-03-31 NOTE — Progress Notes (Signed)
[name], MD Phone: [phone]  Benjamin Dyer is a 51 y.o. male who presents today for hospital follow-up.  Pulmonary embolism: Patient was recently hospitalized for pulmonary embolism and DVT.  He noted he first developed some hip discomfort that he thought was related to straining himself during exercising.  That subsequently resolved after couple of days and then he developed right sided rib discomfort that persisted and caused some difficulty breathing.  He went to the emergency department and was diagnosed with a pulmonary embolus and DVT on the right side.  He had some mild heart strain.  He was evaluated by vascular surgery and underwent pulmonary thrombectomy.  He noted he felt quite a bit better after that.  He is not having any pain or shortness of breath at this time.  He notes about a month prior to this occurring he traveled to [place] by car though notes they got out periodically to use the restroom and walk around.  He has not had any recent surgeries.  He notes no family history of blood clots.  His father does have a history of prostate cancer.  He had a paternal uncle with leukemia.  He sees hematology and vascular surgery in the next couple of weeks.  They do report the area in his right groin that was used for the thrombectomy had some blistering and rash.  He had extensive lab work that has not revealed a hypercoagulable cause for his clots.  Social History   Tobacco Use  Smoking Status Never  Smokeless Tobacco Never    Current Outpatient Medications on File Prior to Visit  Medication Sig Dispense Refill   apixaban (ELIQUIS) 5 MG TABS tablet Take 2 tablets (10 mg total) by mouth 2 (two) times daily. And from 04/03/2021 start taking 5 mg (1 tab) two times a day (twice) 60 tablet 3   Multiple Vitamin (MULTIVITAMIN) tablet Take 1 tablet by mouth daily.     No current facility-administered medications on file prior to visit.     ROS see history of present  illness  Objective  Physical Exam Vitals:   03/31/21 1539  BP: 120/70  Pulse: (!) 54  Temp: 98.6 F (37 C)  SpO2: 96%    BP Readings from Last 3 Encounters:  03/31/21 120/70  03/27/21 122/71  06/30/19 120/70   Wt Readings from Last 3 Encounters:  03/31/21 288 lb (130.6 kg)  03/24/21 280 lb (127 kg)  06/30/19 264 lb 3.2 oz (119.8 kg)    Physical Exam Constitutional:      General: He is not in acute distress.    Appearance: He is not diaphoretic.  Cardiovascular:     Rate and Rhythm: Normal rate and regular rhythm.     Heart sounds: Normal heart sounds.  Pulmonary:     Effort: Pulmonary effort is normal.     Breath sounds: Normal breath sounds.  Musculoskeletal:     Right lower leg: No edema.     Left lower leg: No edema.  Skin:    General: Skin is warm and dry.       Neurological:     Mental Status: He is alert.     Assessment/Plan: Please see individual problem list.  Problem List Items Addressed This Visit     PE (pulmonary thromboembolism) (HCC)    Recent diagnosis.  His history does not point to a specific cause.  His travel to [place] is a possible cause though it seems as though he may not  have been traveling for long enough and this was a month prior to his symptom onset.  He does need cancer screening and we will get a PSA today.  Discussed the need for colonoscopy for colon cancer screening though that we will need to wait until after he is off of the blood thinner.  I discussed the findings of possible pulmonary infarction on his CT imaging and discussed that that could have contributed to his increased pain at the time of diagnosis.  I also discussed that there is a small pleural effusion bilaterally and that he would likely need follow-up imaging though we will let him see his specialist first and determine when that imaging might take place.  Discussed that he would typically need the Eliquis for 3 to 6 months though vascular surgery and hematology  would determine the final length of treatment.  Discussed that given that he is on treatment it would be unlikely that he could have recurrence of symptoms while on the Eliquis though if he did develop any recurrent symptoms he would need to seek medical attention immediately.  Discussed that he could walk though he should not proceed beyond that with his activity level until cleared by his vascular surgeon.  He was advised to avoid any contact sports.  Discussed if he ever had a head injury while on Eliquis he would need to be evaluated to consider imaging of his head.  He will keep his appointments with vascular surgery and hematology.  I will see him back in 3 months.      Other Visit Diagnoses     Prostate cancer screening    -  Primary   Relevant Orders   PSA       Return in about 3 months (around 06/28/2021) for PE follow-up.  This visit occurred during the SARS-CoV-2 public health emergency.  Safety protocols were in place, including screening questions prior to the visit, additional usage of staff PPE, and extensive cleaning of exam room while observing appropriate contact time as indicated for disinfecting solutions.   I have spent 40 minutes in the care of this patient regarding history taking, documentation, review of recent hospital course discussion of plan counseling on return precautions.   [name], MD [facility]

## 2021-03-31 NOTE — Patient Instructions (Signed)
Nice to see you. Please continue on the eliquis.  If you have any recurrent symptoms please seek medical attention.

## 2021-03-31 NOTE — Assessment & Plan Note (Addendum)
Recent diagnosis.  His history does not point to a specific cause.  His travel to [place] is a possible cause though it seems as though he may not have been traveling for long enough and this was a month prior to his symptom onset.  He does need cancer screening and we will get a PSA today.  Discussed the need for colonoscopy for colon cancer screening though that we will need to wait until after he is off of the blood thinner.  I discussed the findings of possible pulmonary infarction on his CT imaging and discussed that that could have contributed to his increased pain at the time of diagnosis.  I also discussed that there is a small pleural effusion bilaterally and that he would likely need follow-up imaging though we will let him see his specialist first and determine when that imaging might take place.  Discussed that he would typically need the Eliquis for 3 to 6 months though vascular surgery and hematology would determine the final length of treatment.  Discussed that given that he is on treatment it would be unlikely that he could have recurrence of symptoms while on the Eliquis though if he did develop any recurrent symptoms he would need to seek medical attention immediately.  Discussed that he could walk though he should not proceed beyond that with his activity level until cleared by his vascular surgeon.  He was advised to avoid any contact sports.  Discussed if he ever had a head injury while on Eliquis he would need to be evaluated to consider imaging of his head.  He will keep his appointments with vascular surgery and hematology.  I will see him back in 3 months.

## 2021-04-01 ENCOUNTER — Encounter: Payer: Self-pay | Admitting: *Deleted

## 2021-04-01 LAB — PSA: PSA: 0.38 ng/mL (ref 0.10–4.00)

## 2021-04-08 ENCOUNTER — Inpatient Hospital Stay: Payer: BC Managed Care – PPO

## 2021-04-08 ENCOUNTER — Encounter: Payer: Self-pay | Admitting: Oncology

## 2021-04-08 ENCOUNTER — Inpatient Hospital Stay: Payer: BC Managed Care – PPO | Attending: Oncology | Admitting: Oncology

## 2021-04-08 ENCOUNTER — Other Ambulatory Visit: Payer: Self-pay

## 2021-04-08 VITALS — BP 111/75 | HR 50 | Temp 96.1°F | Resp 18 | Ht 77.0 in | Wt 284.9 lb

## 2021-04-08 DIAGNOSIS — Z806 Family history of leukemia: Secondary | ICD-10-CM | POA: Diagnosis not present

## 2021-04-08 DIAGNOSIS — I82431 Acute embolism and thrombosis of right popliteal vein: Secondary | ICD-10-CM | POA: Diagnosis not present

## 2021-04-08 DIAGNOSIS — I2699 Other pulmonary embolism without acute cor pulmonale: Secondary | ICD-10-CM | POA: Insufficient documentation

## 2021-04-08 DIAGNOSIS — Z8042 Family history of malignant neoplasm of prostate: Secondary | ICD-10-CM | POA: Diagnosis not present

## 2021-04-08 DIAGNOSIS — Z7901 Long term (current) use of anticoagulants: Secondary | ICD-10-CM | POA: Insufficient documentation

## 2021-04-08 NOTE — Progress Notes (Signed)
Hematology/Oncology Consult note [facility] Telephone:([phone] [phone]  Patient Care Team: Leone Haven, MD as PCP - General (Family Medicine) Sindy Guadeloupe, MD as Consulting Physician (Hematology)   Name of the patient: Benjamin Dyer  IN:[id number]  12/02/1970    Reason for referral-new diagnosis of pulmonary embolism   Referring physician-Dr. Fritzi Mandes  Date of visit: 04/08/21   History of presenting illness- Patient is a 51 year old male who presented to the hospital in February 2023 with symptoms of lower chest pain in the right side.  He underwent CT angio chest which showed right greater than left bilateral lower lobe subsegmental arterial emboli with at least mild findings of right heart strain.  Bilateral lower extremity Doppler showed a nonocclusive DVT in the right popliteal vein.  Patient underwent thrombolysis and mechanical thrombectomy of bilateral pulmonary emboli.  He was started on Eliquis.  He also underwent hypercoagulable work-up done including protein C, protein S activity and Antithrombin III levels which were normal.  Lupus anticoagulant testing did not show any evidence of lupus anticoagulant.  Beta-2 glycoprotein, anticardiolipin antibody testing negative.  Prothrombin gene mutation negative.  Factor V Leiden not detected.  Patient has been referred for further management.  Patient denies any prior history of DVT/PE.  No family history of DVT PE.  No prior history of trauma, hospitalization or immobilization prior to developing DVT/PE.  He is currently on Eliquis and tolerating it well without any significant side effects  ECOG PS- 0  Pain scale- 0   Review of systems- Review of Systems  Constitutional:  Negative for chills, fever, malaise/fatigue and weight loss.  HENT:  Negative for congestion, ear discharge and nosebleeds.   Eyes:  Negative for blurred vision.  Respiratory:  Negative for cough, hemoptysis,  sputum production, shortness of breath and wheezing.   Cardiovascular:  Negative for chest pain, palpitations, orthopnea and claudication.  Gastrointestinal:  Negative for abdominal pain, blood in stool, constipation, diarrhea, heartburn, melena, nausea and vomiting.  Genitourinary:  Negative for dysuria, flank pain, frequency, hematuria and urgency.  Musculoskeletal:  Negative for back pain, joint pain and myalgias.  Skin:  Negative for rash.  Neurological:  Negative for dizziness, tingling, focal weakness, seizures, weakness and headaches.  Endo/Heme/Allergies:  Does not bruise/bleed easily.  Psychiatric/Behavioral:  Negative for depression and suicidal ideas. The patient does not have insomnia.    Allergies  Allergen Reactions   Wound Dressing Adhesive Rash    Patient Active Problem List   Diagnosis Date Noted   PE (pulmonary thromboembolism) ([place]) 03/25/2021   Back strain 06/30/2019   Exertional dyspnea 06/30/2018   Rib pain 06/30/2018   Skin lesion 06/30/2018   Encounter for general adult medical examination with abnormal findings 12/23/2015   Asymmetrical hearing loss, right 03/06/2014     Past Medical History:  Diagnosis Date   DVT (deep venous thrombosis) ([place])    Pulmonary embolism (HCC)      Past Surgical History:  Procedure Laterality Date   PULMONARY THROMBECTOMY Bilateral 03/26/2021   Procedure: PULMONARY THROMBECTOMY;  Surgeon: Algernon Huxley, MD;  Location: [place] [facility];  Service: Cardiovascular;  Laterality: Bilateral;    Social History   Socioeconomic History   Marital status: Married    Spouse name: Not on file   Number of children: Not on file   Years of education: Not on file   Highest education level: Not on file  Occupational History   Not on file  Tobacco Use  Smoking status: Never   Smokeless tobacco: Never  Vaping Use   Vaping Use: Never used  Substance and Sexual Activity   Alcohol use: No   Drug use: No   Sexual activity: Not  on file  Other Topics Concern   Not on file  Social History Narrative   Not on file   Social Determinants of Health   Financial Resource Strain: Not on file  Food Insecurity: Not on file  Transportation Needs: Not on file  Physical Activity: Not on file  Stress: Not on file  Social Connections: Not on file  Intimate Partner Violence: Not on file     Family History  Problem Relation Age of Onset   Arthritis Mother    Prostate cancer Father    Mental illness Sister    Diabetes Maternal Uncle    Stroke Paternal Aunt    Leukemia Paternal Uncle      Current Outpatient Medications:    apixaban (ELIQUIS) 5 MG TABS tablet, Take 2 tablets (10 mg total) by mouth 2 (two) times daily. And from 04/03/2021 start taking 5 mg (1 tab) two times a day (twice), Disp: 60 tablet, Rfl: 3   Multiple Vitamin (MULTIVITAMIN) tablet, Take 1 tablet by mouth daily., Disp: , Rfl:    Physical exam:  Vitals:   04/08/21 1337  BP: 111/75  Pulse: (!) 50  Resp: 18  Temp: (!) 96.1 F (35.6 C)  SpO2: 100%  Weight: 284 lb 14.4 oz (129.2 kg)  Height: 6\' 5"  (1.956 m)   Physical Exam Cardiovascular:     Rate and Rhythm: Normal rate and regular rhythm.     Heart sounds: Normal heart sounds.  Pulmonary:     Effort: Pulmonary effort is normal.     Breath sounds: Normal breath sounds.  Abdominal:     General: Bowel sounds are normal.     Palpations: Abdomen is soft.  Skin:    General: Skin is warm and dry.  Neurological:     Mental Status: He is alert and oriented to person, place, and time.       CMP Latest Ref Rng & Units 03/26/2021  Glucose 70 - 99 mg/dL -  BUN 6 - 20 mg/dL 16  Creatinine 0.61 - 1.24 mg/dL 0.98  Sodium 135 - 145 mmol/L -  Potassium 3.5 - 5.1 mmol/L -  Chloride 98 - 111 mmol/L -  CO2 22 - 32 mmol/L -  Calcium 8.9 - 10.3 mg/dL -  Total Protein 6.5 - 8.1 g/dL -  Total Bilirubin 0.3 - 1.2 mg/dL -  Alkaline Phos 38 - 126 U/L -  AST 15 - 41 U/L -  ALT 0 - 44 U/L -   CBC  Latest Ref Rng & Units 03/26/2021  WBC 4.0 - 10.5 K/uL 9.7  Hemoglobin 13.0 - 17.0 g/dL 15.7  Hematocrit 39.0 - 52.0 % 45.4  Platelets 150 - 400 K/uL 197    No images are attached to the encounter.  DG Chest 2 View  Result Date: 03/24/2021 CLINICAL DATA:  Right-sided upper abdominal pain and right-sided rib pain. EXAM: CHEST - 2 VIEW COMPARISON:  November 08, 2020 FINDINGS: Mildly low lung volumes are present. Mild right basilar atelectasis and/or infiltrate is seen. A very small right pleural effusion is noted. No pneumothorax is identified. The heart size and mediastinal contours are within normal limits. The visualized skeletal structures are unremarkable. IMPRESSION: 1. Mild right basilar atelectasis and/or infiltrate. 2. Very small right pleural effusion. Electronically Signed  By: Virgina Norfolk M.D.   On: 03/24/2021 23:17   CT Angio Chest PE W/Cm &/Or Wo Cm  Result Date: 03/25/2021 CLINICAL DATA:  Right chest pain. EXAM: CT ANGIOGRAPHY CHEST WITH CONTRAST TECHNIQUE: Multidetector CT imaging of the chest was performed using the standard protocol during bolus administration of intravenous contrast. Multiplanar CT image reconstructions and MIPs were obtained to evaluate the vascular anatomy. RADIATION DOSE REDUCTION: This exam was performed according to the departmental dose-optimization program which includes automated exposure control, adjustment of the mA and/or kV according to patient size and/or use of iterative reconstruction technique. CONTRAST:  1105mL OMNIPAQUE IOHEXOL 350 MG/ML SOLN COMPARISON:  PA and lateral chest yesterday.  No prior chest CT. FINDINGS: Cardiovascular: There is mild cardiomegaly with a slight right chamber predominance, mild degree of IVC reflux. Pulmonary arteries are normal in caliber. There are embolic filling defects in multiple right lower lobe basal subsegmental arteries and in least 2 subsegmental left lower lobe posterior basal arteries and possibly a few  downstream small divisions of these arteries. There is no pericardial effusion , no visible coronary artery calcification. There are trace calcifications in the aortic annulus. The aorta is within normal caliber limits with normal great vessels. The pulmonary veins are normal caliber. There is a prominent azygous venous confluence incidentally seen. Mediastinum/Nodes: There is a slightly prominent lymph node in the inferior right hilum measuring 1 cm in short axis. There are no further intrathoracic adenopathy. Visualized thyroid gland, axillary spaces are unremarkable. The thoracic esophagus is unremarkable. The trachea is free of filling defects. Lungs/Pleura: There is a small layering right and trace left pleural effusions. There is patchy consolidation mixed with linear opacities in the right lower lobe, which could be due to pulmonary infarcts, pneumonic infiltrates or combination. There are linear atelectatic foci in the right middle lobe and base of the right upper lobe, and in the left lower lobe in the setting of low lung volumes. The remaining lungs clear.  Central airways are clear. Upper Abdomen: No acute abnormality. Musculoskeletal: There is chronic wedging and endplate irregularities in the lower half of the thoracic spine with degenerative disc disease and scattered endplate Schmorl's nodes. Findings could be due to old trauma or childhood Scheuermann's disease. Review of the MIP images confirms the above findings. IMPRESSION: 1. Right-greater-than-left bilateral lower lobe subsegmental arterial emboli. 2. Overall small clot burden but with a least mild findings of right heart strain noted. 3. Small right pleural effusion, with opacities in the right lower lobe consistent with pneumonic infiltrates and/or infarcts. 4. Single slightly prominent right hilar lymph node possibly reactive. 5. Follow-up study recommended after treatment. 6. Results phoned to Dr. Beather Arbour at 5:58 a.m., 03/25/2021.  Electronically Signed   By: Telford Nab M.D.   On: 03/25/2021 06:05   PERIPHERAL VASCULAR CATHETERIZATION  Result Date: 03/26/2021 See surgical note for result.  US Venous Img Lower Unilateral Right (DVT)  Result Date: 03/25/2021 CLINICAL DATA:  51 year old male with history of right lower extremity pain. EXAM: RIGHT LOWER EXTREMITY VENOUS DOPPLER ULTRASOUND TECHNIQUE: Gray-scale sonography with compression, as well as color and duplex ultrasound, were performed to evaluate the deep venous system(s) from the level of the common femoral vein through the popliteal and proximal calf veins. COMPARISON:  No priors. FINDINGS: VENOUS Normal compressibility of the common femoral, and superficial femoral veins, as well as the visualized deep calf veins. However, there was some echogenic material within the popliteal vein which is compatible with thrombus, which appears nonocclusive  at this time. There also appears to be some superficial thrombus in a gastrocnemius vein in the right calf region. Visualized portions of profunda femoral vein and great saphenous vein unremarkable. No other filling defects to suggest DVT on grayscale or color Doppler imaging. Doppler waveforms show normal direction of venous flow, normal respiratory plasticity and response to augmentation. Limited views of the contralateral common femoral vein are unremarkable. OTHER None. Limitations: none IMPRESSION: 1. Study is positive for nonocclusive deep venous thrombosis in the right popliteal vein. Additional thrombus is noted in some superficial veins in the region of the right gastrocnemius. Negative. Electronically Signed   By: Vinnie Langton M.D.   On: 03/25/2021 06:39   ECHOCARDIOGRAM COMPLETE  Result Date: 03/25/2021    ECHOCARDIOGRAM REPORT   Patient Name:   BENJAMIN FRANKHOUSER Date of Exam: 03/25/2021 Medical Rec #:  [id number]           Height:       76.0 in Accession #:    [id number]          Weight:       280.0 lb Date of Birth:   15-Nov-1970           BSA:          2.556 m Patient Age:    32 years            BP:           118/62 mmHg Patient Gender: M                   HR:           57 bpm. Exam Location:  [facility] Procedure: 2D Echo, Cardiac Doppler, Color Doppler and Saline Contrast Bubble            Study Indications:     Pulmonary Embolus I26.09  History:         Patient has no prior history of Echocardiogram examinations. No                  past medical history on file.  Sonographer:     Sherrie Sport Referring Phys:  [id number] SONA PATEL Diagnosing Phys: Nelva Bush MD IMPRESSIONS  1. Left ventricular ejection fraction, by estimation, is 55 to 60%. The left ventricle has normal function. Left ventricular endocardial border not optimally defined to evaluate regional wall motion. Left ventricular diastolic parameters were normal.  2. Right ventricular systolic function is normal. The right ventricular size is normal.  3. Left atrial size was mild to moderately dilated.  4. Right atrial size was mild to moderately dilated.  5. The mitral valve is grossly normal. Trivial mitral valve regurgitation. No evidence of mitral stenosis.  6. The aortic valve has an indeterminant number of cusps. Aortic valve regurgitation is not visualized. No aortic stenosis is present.  7. Agitated saline contrast bubble study was negative, with no evidence of any interatrial shunt. FINDINGS  Left Ventricle: Left ventricular ejection fraction, by estimation, is 55 to 60%. The left ventricle has normal function. Left ventricular endocardial border not optimally defined to evaluate regional wall motion. The left ventricular internal cavity size was normal in size. There is no left ventricular hypertrophy. Left ventricular diastolic parameters were normal. Right Ventricle: The right ventricular size is normal. No increase in right ventricular wall thickness. Right ventricular systolic function is normal. Left Atrium: Left atrial size was mild to moderately dilated. Right  Atrium: Right atrial size was mild to  moderately dilated. Pericardium: There is no evidence of pericardial effusion. Mitral Valve: The mitral valve is grossly normal. Trivial mitral valve regurgitation. No evidence of mitral valve stenosis. MV peak gradient, 3.1 mmHg. The mean mitral valve gradient is 1.0 mmHg. Tricuspid Valve: The tricuspid valve is normal in structure. Tricuspid valve regurgitation is trivial. Aortic Valve: The aortic valve has an indeterminant number of cusps. Aortic valve regurgitation is not visualized. No aortic stenosis is present. Aortic valve mean gradient measures 4.5 mmHg. Aortic valve peak gradient measures 7.8 mmHg. Aortic valve area, by VTI measures 2.68 cm. Pulmonic Valve: The pulmonic valve was not well visualized. Pulmonic valve regurgitation is trivial. No evidence of pulmonic stenosis. Aorta: The aortic root is normal in size and structure. Pulmonary Artery: The pulmonary artery is not well seen. Venous: The inferior vena cava was not well visualized. IAS/Shunts: The interatrial septum was not well visualized. Agitated saline contrast was given intravenously to evaluate for intracardiac shunting. Agitated saline contrast bubble study was negative, with no evidence of any interatrial shunt.  LEFT VENTRICLE PLAX 2D LVIDd:         4.50 cm   Diastology LVIDs:         3.00 cm   LV e' medial:    9.46 cm/s LV PW:         1.05 cm   LV E/e' medial:  7.3 LV IVS:        0.93 cm   LV e' lateral:   7.18 cm/s LVOT diam:     2.00 cm   LV E/e' lateral: 9.7 LV SV:         76 LV SV Index:   30 LVOT Area:     3.14 cm  RIGHT VENTRICLE RV Basal diam:  3.10 cm RV S prime:     14.90 cm/s TAPSE (M-mode): 3.9 cm LEFT ATRIUM              Index        RIGHT ATRIUM           Index LA diam:        3.80 cm  1.49 cm/m   RA Area:     26.80 cm LA Vol (A2C):   71.4 ml  27.94 ml/m  RA Volume:   84.70 ml  33.14 ml/m LA Vol (A4C):   106.0 ml 41.47 ml/m LA Biplane Vol: 90.6 ml  35.45 ml/m  AORTIC VALVE                     PULMONIC VALVE AV Area (Vmax):    2.47 cm     PV Vmax:        0.91 m/s AV Area (Vmean):   2.39 cm     PV Vmean:       60.500 cm/s AV Area (VTI):     2.68 cm     PV VTI:         0.201 m AV Vmax:           139.50 cm/s  PV Peak grad:   3.3 mmHg AV Vmean:          98.000 cm/s  PV Mean grad:   2.0 mmHg AV VTI:            0.285 m      RVOT Peak grad: 7 mmHg AV Peak Grad:      7.8 mmHg AV Mean Grad:      4.5 mmHg LVOT Vmax:  109.50 cm/s LVOT Vmean:        74.650 cm/s LVOT VTI:          0.242 m LVOT/AV VTI ratio: 0.85  AORTA Ao Root diam: 3.03 cm MITRAL VALVE MV Area (PHT): 2.99 cm    SHUNTS MV Area VTI:   2.81 cm    Systemic VTI:  0.24 m MV Peak grad:  3.1 mmHg    Systemic Diam: 2.00 cm MV Mean grad:  1.0 mmHg    Pulmonic VTI:  0.216 m MV Vmax:       0.88 m/s MV Vmean:      42.0 cm/s MV Decel Time: 254 msec MV E velocity: 69.40 cm/s MV A velocity: 58.70 cm/s MV E/A ratio:  1.18 Nelva Bush MD Electronically signed by Nelva Bush MD Signature Date/Time: 03/25/2021/7:35:24 PM    Final    US Abdomen Limited RUQ (LIVER/GB)  Result Date: 03/25/2021 CLINICAL DATA:  Right upper quadrant pain. EXAM: ULTRASOUND ABDOMEN LIMITED RIGHT UPPER QUADRANT COMPARISON:  None. FINDINGS: Gallbladder: The gallbladder is mildly contracted. No gallstones or wall thickening visualized (2.8 mm). No sonographic Murphy sign noted by sonographer. Common bile duct: Diameter: 3.3 mm Liver: No focal lesion identified. Within normal limits in parenchymal echogenicity. Portal vein is patent on color Doppler imaging with normal direction of blood flow towards the liver. Other: A trace amount of pleural fluid is suspected on the right. IMPRESSION: 1. Trace right pleural effusion. 2. Otherwise, unremarkable right upper quadrant ultrasound. Electronically Signed   By: Virgina Norfolk M.D.   On: 03/25/2021 00:32    Assessment and plan- Patient is a 51 y.o. male with newly diagnosed right lower extremity DVT and bilateral  pulmonary embolus here to discuss further anticoagulation management  Discussed with the patient the results of hypercoagulable work-up which did not show any abnormality.  Patient's DVT and bilateral PE appears unprovoked.  He is currently on Eliquis and I would recommend that he should at least stay on anticoagulation for 6 months.  Discussed that male sex as well as proximal lower extremity DVT or risk factors for recurrent pulmonary emboli and if he does not have any bleeding complications he should ideally stay on anticoagulation lifelong.  He can continue to follow-up with his primary care doctor and.  Weekly reviewed risks versus benefits of bleeding versus clotting and as long as he is tolerating anticoagulation well he can stay on it indefinitely.  Patient does not have any B symptoms or other suspicious symptoms that would be suggestive of malignancy that would mandate a malignancy work-up.  CT chest was otherwise not suggestive of any malignancy.  I would not recommend a CT abdomen at this time.  However patient is 51 years of age and should undergo screening colonoscopy in due course.  Since his PE was relatively recent, I would recommend waiting on getting a colonoscopy done until August 2023 and following that it would be okay for patient to pursue a colonoscopy and holding Eliquis 2 to 3 days prior to procedure.  Discussed that there are reversal agents available for Eliquis should patient develop significant bleeding on it.  Also recommended that patient should try to lose some weight and get his BMI down which is one of the reversible risk factors for subsequent   Thank you for this kind referral and the opportunity to participate in the care of this patient   Visit Diagnosis 1. Bilateral pulmonary embolism (HCC)     Dr. Randa Evens, MD, MPH  [place] [facility] [id number] 04/08/2021

## 2021-04-16 ENCOUNTER — Ambulatory Visit (INDEPENDENT_AMBULATORY_CARE_PROVIDER_SITE_OTHER): Payer: BC Managed Care – PPO

## 2021-04-16 ENCOUNTER — Encounter (INDEPENDENT_AMBULATORY_CARE_PROVIDER_SITE_OTHER): Payer: Self-pay | Admitting: Nurse Practitioner

## 2021-04-16 ENCOUNTER — Other Ambulatory Visit (INDEPENDENT_AMBULATORY_CARE_PROVIDER_SITE_OTHER): Payer: Self-pay | Admitting: Vascular Surgery

## 2021-04-16 ENCOUNTER — Other Ambulatory Visit: Payer: Self-pay

## 2021-04-16 ENCOUNTER — Ambulatory Visit (INDEPENDENT_AMBULATORY_CARE_PROVIDER_SITE_OTHER): Payer: BC Managed Care – PPO | Admitting: Nurse Practitioner

## 2021-04-16 DIAGNOSIS — I82431 Acute embolism and thrombosis of right popliteal vein: Secondary | ICD-10-CM

## 2021-04-27 ENCOUNTER — Encounter (INDEPENDENT_AMBULATORY_CARE_PROVIDER_SITE_OTHER): Payer: Self-pay | Admitting: Nurse Practitioner

## 2021-04-27 NOTE — Progress Notes (Signed)
? ?Subjective:  ? ? Patient ID: Benjamin Dyer, male    DOB: August 20, 1970, 51 y.o.   MRN: [id number] ?Chief Complaint  ?Patient presents with  ? Follow-up  ?  Ultrasound  ? ? ?Benjamin Dyer is a 51 year old male that presents today following thrombectomy of his pulmonary arteries after recent pulmonary embolism.  It was also discovered that the patient had a right lower extremity DVT within the popliteal and tibial veins.  The patient tolerated the thrombectomy procedure well.  He denies any swelling or issues with his right lower extremity.  There is some question as to whether the DVT was provoked given recent driving or unprovoked.  The patient was placed on heparin in the hospital and transition to Eliquis.  Currently he is doing well with compliance and taking his Eliquis.  He denies any significant issues with bleeding.  Also notes that there is no residual chest pain or shortness of breath following PR thrombectomy. ? ?Today noninvasive study shows no evidence of DVT in the right lower extremity.  The findings are improved from the previous examination.  The left has no evidence of common femoral vein obstruction. ? ? ?Review of Systems  ?Respiratory:  Negative for shortness of breath.   ?Skin:  Negative for wound.  ?All other systems reviewed and are negative. ? ?   ?Objective:  ? Physical Exam ?Vitals reviewed.  ?HENT:  ?   Head: Normocephalic.  ?Cardiovascular:  ?   Rate and Rhythm: Normal rate.  ?   Pulses: Normal pulses.  ?Pulmonary:  ?   Effort: Pulmonary effort is normal.  ?Musculoskeletal:  ?   Right lower leg: No edema.  ?Skin: ?   General: Skin is warm and dry.  ?Neurological:  ?   Mental Status: He is alert and oriented to person, place, and time.  ?Psychiatric:     ?   Mood and Affect: Mood normal.     ?   Behavior: Behavior normal.     ?   Thought Content: Thought content normal.     ?   Judgment: Judgment normal.  ? ? ?BP 110/69 (BP Location: Right Arm)   Pulse (!) 50   Resp 16   Ht 6\' 4"   (1.93 m)   Wt 284 lb 9.6 oz (129.1 kg)   BMI 34.64 kg/m?  ? ?Past Medical History:  ?Diagnosis Date  ? DVT (deep venous thrombosis) (HCC)   ? Pulmonary embolism (HCC)   ? ? ?Social History  ? ?Socioeconomic History  ? Marital status: Married  ?  Spouse name: Not on file  ? Number of children: Not on file  ? Years of education: Not on file  ? Highest education level: Not on file  ?Occupational History  ? Not on file  ?Tobacco Use  ? Smoking status: Never  ? Smokeless tobacco: Never  ?Vaping Use  ? Vaping Use: Never used  ?Substance and Sexual Activity  ? Alcohol use: No  ? Drug use: No  ? Sexual activity: Not on file  ?Other Topics Concern  ? Not on file  ?Social History Narrative  ? Not on file  ? ?Social Determinants of Health  ? ?Financial Resource Strain: Not on file  ?Food Insecurity: Not on file  ?Transportation Needs: Not on file  ?Physical Activity: Not on file  ?Stress: Not on file  ?Social Connections: Not on file  ?Intimate Partner Violence: Not on file  ? ? ?Past Surgical History:  ?Procedure Laterality Date  ?  PULMONARY THROMBECTOMY Bilateral 03/26/2021  ? Procedure: PULMONARY THROMBECTOMY;  Surgeon: Annice Needy, MD;  Location: [facility] INVASIVE [facility];  Service: Cardiovascular;  Laterality: Bilateral;  ? ? ?Family History  ?Problem Relation Age of Onset  ? Arthritis Mother   ? Prostate cancer Father   ? Mental illness Sister   ? Diabetes Maternal Uncle   ? Stroke Paternal Aunt   ? Leukemia Paternal Uncle   ? ? ?Allergies  ?Allergen Reactions  ? Wound Dressing Adhesive Rash  ? ? ? ?  Latest Ref Rng & Units 03/26/2021  ?  3:03 AM 03/24/2021  ? 11:04 PM 11/09/2019  ?  8:45 AM  ?CBC  ?WBC 4.0 - 10.5 K/uL 9.7   10.2   5.1    ?Hemoglobin 13.0 - 17.0 g/dL 02.6   37.8   58.8    ?Hematocrit 39.0 - 52.0 % 45.4   47.3   48.2    ?Platelets 150 - 400 K/uL 197   230   189    ? ? ? ? ?CMP  ?   ?Component Value Date/Time  ? NA 139 03/24/2021 2304  ? NA 139 11/09/2019 0845  ? K 3.7 03/24/2021 2304  ? CL 100 03/24/2021 2304   ? CO2 27 03/24/2021 2304  ? GLUCOSE 109 (H) 03/24/2021 2304  ? BUN 16 03/26/2021 0303  ? BUN 18 11/09/2019 0845  ? CREATININE 0.98 03/26/2021 0303  ? CALCIUM 9.5 03/24/2021 2304  ? PROT 7.7 03/24/2021 2304  ? PROT 7.0 11/09/2019 0845  ? ALBUMIN 4.3 03/24/2021 2304  ? ALBUMIN 4.5 11/09/2019 0845  ? AST 21 03/24/2021 2304  ? ALT 28 03/24/2021 2304  ? ALKPHOS 78 03/24/2021 2304  ? BILITOT 1.1 03/24/2021 2304  ? BILITOT 1.1 11/09/2019 0845  ? GFRNONAA >60 03/26/2021 0303  ? GFRAA 90 11/09/2019 0845  ? ? ? ?No results found. ? ?   ?Assessment & Plan:  ? ?1. Acute deep vein thrombosis (DVT) of popliteal vein of right lower extremity (HCC) ?The patient also had a pulmonary embolism with pulmonary thrombectomy that he underwent due to his DVT.  Based on this pulmonary embolism, regardless of the cause, we advised the patient to stay on anticoagulation for at least 1 year.  At which point there can be discussion as to transition to aspirin only, maintaining full dose Eliquis, or moving to a maintenance dose of Eliquis.  We will have the patient return in 1 year, or sooner if issues arise. ?- VAS [place] LOWER EXTREMITY VENOUS (DVT) ? ? ?Current Outpatient Medications on File Prior to Visit  ?Medication Sig Dispense Refill  ? apixaban (ELIQUIS) 5 MG TABS tablet Take 2 tablets (10 mg total) by mouth 2 (two) times daily. And from 04/03/2021 start taking 5 mg (1 tab) two times a day (twice) 60 tablet 3  ? Multiple Vitamin (MULTIVITAMIN) tablet Take 1 tablet by mouth daily.    ? ?No current facility-administered medications on file prior to visit.  ? ? ?There are no Patient Instructions on file for this visit. ?No follow-ups on file. ? ? ?Georgiana Spinner, NP ? ? ?

## 2021-07-01 ENCOUNTER — Telehealth: Payer: BC Managed Care – PPO | Admitting: Family Medicine

## 2021-07-01 NOTE — Progress Notes (Signed)
Patient rescheduled for an in person visit due to this visit being changed to virtual the day of his visit.

## 2021-07-21 ENCOUNTER — Other Ambulatory Visit (INDEPENDENT_AMBULATORY_CARE_PROVIDER_SITE_OTHER): Payer: Self-pay

## 2021-07-21 ENCOUNTER — Telehealth (INDEPENDENT_AMBULATORY_CARE_PROVIDER_SITE_OTHER): Payer: Self-pay | Admitting: Vascular Surgery

## 2021-07-21 MED ORDER — APIXABAN 5 MG PO TABS: 10.0000 mg | ORAL_TABLET | Freq: Two times a day (BID) | ORAL | 3 refills | Status: DC

## 2021-07-21 NOTE — Telephone Encounter (Signed)
Pt. Called and stated he is out of his apixaban (ELIQUIS) 5 MG TABS tablet [[id number]]. He states it can be sent to the [facility] at [address], [place]

## 2021-07-21 NOTE — Telephone Encounter (Signed)
Refill sent and detailed message was left a patient voicemail.

## 2021-08-08 ENCOUNTER — Ambulatory Visit: Payer: BC Managed Care – PPO | Admitting: Family Medicine

## 2021-08-08 ENCOUNTER — Ambulatory Visit (INDEPENDENT_AMBULATORY_CARE_PROVIDER_SITE_OTHER): Payer: BC Managed Care – PPO

## 2021-08-08 ENCOUNTER — Encounter: Payer: Self-pay | Admitting: Family Medicine

## 2021-08-08 VITALS — BP 110/70 | HR 44 | Temp 99.0°F | Ht 76.0 in | Wt 284.4 lb

## 2021-08-08 DIAGNOSIS — I2699 Other pulmonary embolism without acute cor pulmonale: Secondary | ICD-10-CM

## 2021-08-08 DIAGNOSIS — G8929 Other chronic pain: Secondary | ICD-10-CM

## 2021-08-08 DIAGNOSIS — M25561 Pain in right knee: Secondary | ICD-10-CM | POA: Diagnosis not present

## 2021-08-08 DIAGNOSIS — Z1211 Encounter for screening for malignant neoplasm of colon: Secondary | ICD-10-CM

## 2021-08-08 DIAGNOSIS — M7989 Other specified soft tissue disorders: Secondary | ICD-10-CM | POA: Diagnosis not present

## 2021-08-08 NOTE — Progress Notes (Signed)
Benjamin Alar, MD Phone: [phone]  Waller Marcussen is a 51 y.o. male who presents today for follow-up.  PE: Patient is currently on treatment dose of Eliquis.  He notes no bleeding issues, chest pain, shortness of breath, or swelling.  He has seen vascular surgery and hematology.  Vascular surgery said he needed to be on the Eliquis for at least a year.  Hematology said at least 6 months though preferably lifetime.  He does need to have a colonoscopy to rule out underlying colon cancer as a possible cause of his PE.  He cannot come off of his Eliquis until August.  Right knee pain: Patient notes right knee discomfort and swelling particularly after playing basketball.  He notes the knee will occasionally buckle on him.  He works out 4 times a week and notes his legs feel more tired after working out than they did previously.  Notes the right leg bothers him more than the left leg.  He notes it feels like his muscles are pulling.  Notes that started after having the blood clot.  He notes his knee does not lock or pop.  Social History   Tobacco Use  Smoking Status Never  Smokeless Tobacco Never    Current Outpatient Medications on File Prior to Visit  Medication Sig Dispense Refill   apixaban (ELIQUIS) 5 MG TABS tablet Take 2 tablets (10 mg total) by mouth 2 (two) times daily. And from 04/03/2021 start taking 5 mg (1 tab) two times a day (twice) 60 tablet 3   Multiple Vitamin (MULTIVITAMIN) tablet Take 1 tablet by mouth daily.     No current facility-administered medications on file prior to visit.     ROS see history of present illness  Objective  Physical Exam Vitals:   08/08/21 0903  BP: 110/70  Pulse: (!) 44  Temp: 99 F (37.2 C)  SpO2: 99%    BP Readings from Last 3 Encounters:  08/08/21 110/70  04/16/21 110/69  04/08/21 111/75   Wt Readings from Last 3 Encounters:  08/08/21 284 lb 6.4 oz (129 kg)  04/16/21 284 lb 9.6 oz (129.1 kg)  04/08/21 284 lb 14.4 oz  (129.2 kg)    Physical Exam Constitutional:      General: He is not in acute distress.    Appearance: He is not diaphoretic.  Cardiovascular:     Rate and Rhythm: Normal rate and regular rhythm.     Heart sounds: Normal heart sounds.  Pulmonary:     Effort: Pulmonary effort is normal.     Breath sounds: Normal breath sounds.  Musculoskeletal:     Comments: Right knee with no swelling, warmth, erythema, or tenderness, negative McMurray's  Skin:    General: Skin is warm and dry.  Neurological:     Mental Status: He is alert.      Assessment/Plan: Please see individual problem list.  Problem List Items Addressed This Visit     PE (pulmonary thromboembolism) (HCC) (Chronic)    Patient will continue Eliquis 5 mg twice daily.  Discussed that he would need to have a conversation with vascular surgery and hematology to determine if he could come off of the Eliquis and go on aspirin or reduce dose of Eliquis.  Discussed that this would be done once he has been on the Eliquis for a year.      Right knee pain (Chronic)    This is an ongoing issue.  Discussed that this potentially would be arthritis or meniscal  injury related.  We will get an x-ray today.  He was given exercises to complete at home.  Discussed that it would be less likely that his bilateral leg symptoms would be related to his history of blood clot or being on the Eliquis.      Relevant Orders   DG Knee Complete 4 Views Right   Other Visit Diagnoses     Colon cancer screening    -  Primary   Relevant Orders   Ambulatory referral to Gastroenterology        Health Maintenance: Patient is due for colonoscopy.  Referral will be placed to have this done in September once he has been on Eliquis for 6 months.  Per hematology note he would be able to hold the Eliquis 2 to 3 days prior to the procedure at that time.  Return in about 6 months (around 02/08/2022).   Benjamin Alar, MD [facility]

## 2021-08-08 NOTE — Assessment & Plan Note (Signed)
Patient will continue Eliquis 5 mg twice daily.  Discussed that he would need to have a conversation with vascular surgery and hematology to determine if he could come off of the Eliquis and go on aspirin or reduce dose of Eliquis.  Discussed that this would be done once he has been on the Eliquis for a year.

## 2021-08-08 NOTE — Patient Instructions (Addendum)
Nice to see you. Please do the exercises for your leg.  If its not improving please let us know. GI should contact you to schedule your colonoscopy for September. We will get an x-ray today of your right knee and let you know what the results are.  Exercises for Chronic Knee Pain Chronic knee pain is pain that lasts longer than 3 months. For most people with chronic knee pain, exercise and weight loss is an important part of treatment. Your health care provider may want you to focus on: Strengthening the muscles that support your knee. This can take pressure off your knee and lessen pain. Preventing knee stiffness. Maintaining or increasing how far you can move your knee. Losing weight (if this applies) to take pressure off your knee, decrease your risk for injury, and make it easier for you to exercise. Your health care provider will help you develop an exercise program that matches your needs and physical abilities. Below are simple, low-impact exercises you can do at home. Ask your health care provider or a physical therapist how often you should do your exercise program and how many times to repeat each exercise. General safety tips Follow these safety tips for exercising with chronic knee pain: Get your health care provider's approval before doing any exercises. Start slowly and stop any time an exercise causes pain. Do not exercise if your knee pain is flaring up. Warm up first. Stretching a cold muscle can cause an injury. Do 5-10 minutes of easy movement or light stretching before beginning your exercise routine. Do 5-10 minutes of low-impact activity (like walking or cycling) before starting strengthening exercises. Contact your health care provider any time you have pain during or after exercising. Exercise may cause discomfort but should not be painful. It is normal to be a little stiff or sore after exercising.  Stretching and range-of-motion exercises Front thigh stretch  Stand  up straight and support your body by holding on to a chair or resting one hand on a wall. With your legs straight and close together, bend one knee to lift your heel up toward your buttocks. Using one hand for support, grab your ankle with your free hand. Pull your foot up closer toward your buttocks to feel the stretch in front of your thigh. Hold the stretch for 30 seconds. Repeat __________ times. Complete this exercise __________ times a day. Back thigh stretch  Sit on the floor with your back straight and your legs out straight in front of you. Place the palms of your hands on the floor and slide them toward your feet as you bend at the hip. Try to touch your nose to your knees and feel the stretch in the back of your thighs. Hold for 30 seconds. Repeat __________ times. Complete this exercise __________ times a day. Calf stretch  Stand facing a wall. Place the palms of your hands flat against the wall, arms extended, and lean slightly against the wall. Get into a lunge position with one leg bent at the knee and the other leg stretched out straight behind you. Keep both feet facing the wall and increase the bend in your knee while keeping the heel of the other leg flat on the ground. You should feel the stretch in your calf. Hold for 30 seconds. Repeat __________ times. Complete this exercise __________ times a day. Strengthening exercises Straight leg lift Lie on your back with one knee bent and the other leg out straight. Slowly lift the straight leg  without bending the knee. Lift until your foot is about 12 inches (30 cm) off the floor. Hold for 3-5 seconds and slowly lower your leg. Repeat __________ times. Complete this exercise __________ times a day. Single leg dip Stand between two chairs and put both hands on the backs of the chairs for support. Extend one leg out straight with your body weight resting on the heel of the standing leg. Slowly bend your standing knee to dip  your body to the level that is comfortable for you. Hold for 3-5 seconds. Repeat __________ times. Complete this exercise __________ times a day. Hamstring curls Stand straight, knees close together, facing the back of a chair. Hold on to the back of a chair with both hands. Keep one leg straight. Bend the other knee while bringing the heel up toward the buttock until the knee is bent at a 90-degree angle (right angle). Hold for 3-5 seconds. Repeat __________ times. Complete this exercise __________ times a day. Wall squat Stand straight with your back, hips, and head against a wall. Step forward one foot at a time with your back still against the wall. Your feet should be 2 feet (61 cm) from the wall at shoulder width. Keeping your back, hips, and head against the wall, slide down the wall to as close of a sitting position as you can get. Hold for 5-10 seconds, then slowly slide back up. Repeat __________ times. Complete this exercise __________ times a day. Step-ups Step up with one foot onto a sturdy platform or stool that is about 6 inches (15 cm) high. Face sideways with one foot on the platform and one on the ground. Place all your weight on the platform foot and lift your body off the ground until your knee extends. Let your other leg hang free to the side. Hold for 3-5 seconds then slowly lower your weight down to the floor foot. Repeat __________ times. Complete this exercise __________ times a day. Contact a health care provider if: Your exercise causes pain. Your pain is worse after you exercise. Your pain prevents you from doing your exercises. This information is not intended to replace advice given to you by your health care provider. Make sure you discuss any questions you have with your health care provider. Document Revised: 05/25/2019 Document Reviewed: 01/16/2019 Elsevier Patient Education  2023 [employer].

## 2021-08-08 NOTE — Assessment & Plan Note (Signed)
This is an ongoing issue.  Discussed that this potentially would be arthritis or meniscal injury related.  We will get an x-ray today.  He was given exercises to complete at home.  Discussed that it would be less likely that his bilateral leg symptoms would be related to his history of blood clot or being on the Eliquis.

## 2021-11-18 ENCOUNTER — Other Ambulatory Visit (INDEPENDENT_AMBULATORY_CARE_PROVIDER_SITE_OTHER): Payer: Self-pay | Admitting: Vascular Surgery

## 2021-12-01 ENCOUNTER — Encounter (INDEPENDENT_AMBULATORY_CARE_PROVIDER_SITE_OTHER): Payer: Self-pay

## 2022-02-09 ENCOUNTER — Encounter: Payer: Self-pay | Admitting: Family Medicine

## 2022-02-09 ENCOUNTER — Ambulatory Visit: Payer: BC Managed Care – PPO | Admitting: Family Medicine

## 2022-02-09 VITALS — BP 100/60 | HR 55 | Temp 97.8°F | Ht 76.0 in | Wt 268.0 lb

## 2022-02-09 DIAGNOSIS — Z1211 Encounter for screening for malignant neoplasm of colon: Secondary | ICD-10-CM

## 2022-02-09 DIAGNOSIS — G8929 Other chronic pain: Secondary | ICD-10-CM

## 2022-02-09 DIAGNOSIS — M25561 Pain in right knee: Secondary | ICD-10-CM | POA: Diagnosis not present

## 2022-02-09 DIAGNOSIS — I2699 Other pulmonary embolism without acute cor pulmonale: Secondary | ICD-10-CM

## 2022-02-09 NOTE — Patient Instructions (Addendum)
Nice to see you. GI should call you to set up your colonoscopy.  You can try glucosamine or osteobiflex to help with your knees.  If it is not beneficial you can discontinue these.  Do not take any NSAIDs such as ibuprofen or Aleve while you are on the Eliquis.  You can try Voltaren gel topically if needed.

## 2022-02-09 NOTE — Assessment & Plan Note (Signed)
Patient will continue Eliquis 5 mg twice daily.  We will refer to GI for colon cancer screening.  He will continue to follow with hematology and vascular surgery.

## 2022-02-09 NOTE — Progress Notes (Signed)
  Benjamin Rumps, MD Phone: [phone]  Benjamin Dyer is a 52 y.o. male who presents today for follow-up.  Pulmonary embolism: Patient continues on this.  He rarely misses a dose.  If he does miss a dose he notes his legs feel little different.  No chest pain, shortness of breath, swelling, or bleeding.  He has not seen GI to rule out underlying colon cancer as a cause of his pulmonary embolus.  Knee soreness: Patient notes he did lunges last week and his legs are still bothering him from this.  He wonders if there is a joint supplement he can take.  He notes the discomfort is all over his knees.  Social History   Tobacco Use  Smoking Status Never  Smokeless Tobacco Never    Current Outpatient Medications on File Prior to Visit  Medication Sig Dispense Refill   ELIQUIS 5 MG TABS tablet TAKE 2TABS 2 (TWO) TIMES DAILY. AND FROM 04/03/2021 START TAKING 5 MG (1 TAB) TWO TIMES A DAY (TWICE) 60 tablet 3   Multiple Vitamin (MULTIVITAMIN) tablet Take 1 tablet by mouth daily.     No current facility-administered medications on file prior to visit.     ROS see history of present illness  Objective  Physical Exam Vitals:   02/09/22 0821  BP: 100/60  Pulse: (!) 55  Temp: 97.8 F (36.6 C)  SpO2: 99%    BP Readings from Last 3 Encounters:  02/09/22 100/60  08/08/21 110/70  04/16/21 110/69   Wt Readings from Last 3 Encounters:  02/09/22 268 lb (121.6 kg)  08/08/21 284 lb 6.4 oz (129 kg)  04/16/21 284 lb 9.6 oz (129.1 kg)    Physical Exam Constitutional:      General: He is not in acute distress.    Appearance: He is not diaphoretic.  Cardiovascular:     Rate and Rhythm: Normal rate and regular rhythm.     Heart sounds: Normal heart sounds.  Pulmonary:     Effort: Pulmonary effort is normal.     Breath sounds: Normal breath sounds.  Musculoskeletal:     Comments: Bilateral knees with no swelling or tenderness, negative McMurray's bilaterally  Skin:    General:  Skin is warm and dry.  Neurological:     Mental Status: He is alert.      Assessment/Plan: Please see individual problem list.  Chronic pain of right knee Assessment & Plan: Likely muscular soreness related to recently doing lunges.  This may get better on its own as he does more exercise.  Discussed he could try glucosamine or Osteo Bi-Flex as a joint supplement though if it is not helpful he should discontinue this.  Advised not to take any ibuprofen, Aleve, or other NSAIDs while on Eliquis.  Discussed he could try Voltaren gel if needed.   PE (pulmonary thromboembolism) ([place]) Assessment & Plan: Patient will continue Eliquis 5 mg twice daily.  We will refer to GI for colon cancer screening.  He will continue to follow with hematology and vascular surgery.   Colon cancer screening -     Ambulatory referral to Gastroenterology     Return in about 6 months (around 08/10/2022) for physical.   Benjamin Rumps, MD [facility]

## 2022-02-09 NOTE — Assessment & Plan Note (Signed)
Likely muscular soreness related to recently doing lunges.  This may get better on its own as he does more exercise.  Discussed he could try glucosamine or Osteo Bi-Flex as a joint supplement though if it is not helpful he should discontinue this.  Advised not to take any ibuprofen, Aleve, or other NSAIDs while on Eliquis.  Discussed he could try Voltaren gel if needed.

## 2022-02-12 ENCOUNTER — Telehealth: Payer: Self-pay

## 2022-02-12 ENCOUNTER — Other Ambulatory Visit: Payer: Self-pay

## 2022-02-12 DIAGNOSIS — Z1211 Encounter for screening for malignant neoplasm of colon: Secondary | ICD-10-CM

## 2022-02-12 MED ORDER — NA SULFATE-K SULFATE-MG SULF 17.5-3.13-1.6 GM/177ML PO SOLN: 354.0000 mL | Freq: Once | ORAL | 0 refills | Status: AC

## 2022-02-12 NOTE — Telephone Encounter (Signed)
Gastroenterology Pre-Procedure Review  Request Date: 03/20/2022 Requesting Physician: Dr. Marius Ditch  PATIENT REVIEW QUESTIONS: The patient responded to the following health history questions as indicated:    1. Are you having any GI issues? no 2. Do you have a personal history of Polyps? no 3. Do you have a family history of Colon Cancer or Polyps? no 4. Diabetes Mellitus? no 5. Joint replacements in the past 12 months?no 6. Major health problems in the past 3 months?no 7. Any artificial heart valves, MVP, or defibrillator?no    MEDICATIONS & ALLERGIES:    Patient reports the following regarding taking any anticoagulation/antiplatelet therapy:   Plavix, Coumadin, Eliquis, Xarelto, Lovenox, Pradaxa, Brilinta, or Effient? Yes Eliquis Dr. Lucky Cowboy  Aspirin? no  Patient confirms/reports the following medications:  Current Outpatient Medications  Medication Sig Dispense Refill   ELIQUIS 5 MG TABS tablet TAKE 2TABS 2 (TWO) TIMES DAILY. AND FROM 04/03/2021 START TAKING 5 MG (1 TAB) TWO TIMES A DAY (TWICE) 60 tablet 3   Multiple Vitamin (MULTIVITAMIN) tablet Take 1 tablet by mouth daily.     No current facility-administered medications for this visit.    Patient confirms/reports the following allergies:  Allergies  Allergen Reactions   Wound Dressing Adhesive Rash    No orders of the defined types were placed in this encounter.   AUTHORIZATION INFORMATION Primary Insurance: 1D#: Group #:  Secondary Insurance: 1D#: Group #:  SCHEDULE INFORMATION: Date:  Time: Location:

## 2022-02-19 ENCOUNTER — Telehealth: Payer: Self-pay

## 2022-02-19 NOTE — Telephone Encounter (Signed)
Per Vascular patient cal stop the Eliquis 2 days before procedure and restart it 1 day after procedure. Patient verbalized understanding of instructions

## 2022-03-16 ENCOUNTER — Telehealth: Payer: Self-pay | Admitting: Gastroenterology

## 2022-03-16 NOTE — Telephone Encounter (Signed)
Patients wife calling stating that the patient needs to reschedule his upcoming procedure due to a death in the family.

## 2022-03-17 ENCOUNTER — Telehealth: Payer: Self-pay | Admitting: Gastroenterology

## 2022-03-17 NOTE — Telephone Encounter (Signed)
Patient wants to reschedule 04/03/2022. Called trish and informed her of the move. Sent out new instructions

## 2022-03-17 NOTE — Telephone Encounter (Signed)
Pt canceled procedure for 03/20/2022 will call back to reschedule

## 2022-03-23 ENCOUNTER — Other Ambulatory Visit (INDEPENDENT_AMBULATORY_CARE_PROVIDER_SITE_OTHER): Payer: Self-pay | Admitting: Nurse Practitioner

## 2022-03-31 ENCOUNTER — Telehealth: Payer: Self-pay | Admitting: Gastroenterology

## 2022-03-31 NOTE — Telephone Encounter (Signed)
Called and left a message for call back  

## 2022-03-31 NOTE — Telephone Encounter (Signed)
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

## 2022-03-31 NOTE — Telephone Encounter (Signed)
Patient calling to reschedule colonoscopy. Requesting call back.

## 2022-04-01 NOTE — Telephone Encounter (Signed)
Patient wants to rescheduled the procedure to 05/15/2022.Called endo and talk to [place]

## 2022-04-14 ENCOUNTER — Encounter (INDEPENDENT_AMBULATORY_CARE_PROVIDER_SITE_OTHER): Payer: Self-pay | Admitting: Vascular Surgery

## 2022-04-14 ENCOUNTER — Ambulatory Visit (INDEPENDENT_AMBULATORY_CARE_PROVIDER_SITE_OTHER): Payer: BC Managed Care – PPO | Admitting: Vascular Surgery

## 2022-04-14 VITALS — BP 108/66 | HR 60 | Resp 16 | Wt 281.0 lb

## 2022-04-14 DIAGNOSIS — I2699 Other pulmonary embolism without acute cor pulmonale: Secondary | ICD-10-CM | POA: Diagnosis not present

## 2022-04-14 DIAGNOSIS — I82531 Chronic embolism and thrombosis of right popliteal vein: Secondary | ICD-10-CM

## 2022-04-14 NOTE — Progress Notes (Signed)
MRN : [id number]  Benjamin Dyer is a 52 y.o. (08/06/1970) male who presents with chief complaint of  Chief Complaint  Patient presents with   Follow-up    1 yr follow up  .  History of Present Illness: Patient returns today in follow up of his DVT and pulmonary embolus.  He is about 1 year status post pulmonary thrombectomy for symptomatic bilateral pulmonary emboli.  He has done very well.  He has been tolerating anticoagulation now for roughly 1 year.  No signs of bleeding or other issues.  He is young and reasonably healthy and there was not a clear inciting event for his thrombotic episode.  No significant leg pain or swelling of concern.  Seems to be doing reasonably well.  Current Outpatient Medications  Medication Sig Dispense Refill   apixaban (ELIQUIS) 2.5 MG TABS tablet Take 1 tablet (2.5 mg total) by mouth 2 (two) times daily. 60 tablet 11   ELIQUIS 5 MG TABS tablet TAKE 2TABS 2 (TWO) TIMES DAILY. AND FROM 04/03/2021 START TAKING 5 MG (1 TAB) TWO TIMES A DAY (TWICE) 60 tablet 3   Multiple Vitamin (MULTIVITAMIN) tablet Take 1 tablet by mouth daily.     No current facility-administered medications for this visit.    Past Medical History:  Diagnosis Date   DVT (deep venous thrombosis) ([place])    Pulmonary embolism ([facility])     Past Surgical History:  Procedure Laterality Date   PULMONARY THROMBECTOMY Bilateral 03/26/2021   Procedure: PULMONARY THROMBECTOMY;  Surgeon: Algernon Huxley, MD;  Location: [facility];  Service: Cardiovascular;  Laterality: Bilateral;     Social History   Tobacco Use   Smoking status: Never   Smokeless tobacco: Never  Vaping Use   Vaping Use: Never used  Substance Use Topics   Alcohol use: No   Drug use: No       Family History  Problem Relation Age of Onset   Arthritis Mother    Prostate cancer Father    Mental illness Sister    Diabetes Maternal Uncle    Stroke Paternal Aunt    Leukemia Paternal Uncle       Allergies  Allergen Reactions   Wound Dressing Adhesive Rash     REVIEW OF SYSTEMS (Negative unless checked)  Constitutional: '[]'$ Weight loss  '[]'$ Fever  '[]'$ Chills Cardiac: '[]'$ Chest pain   '[]'$ Chest pressure   '[]'$ Palpitations   '[]'$ Shortness of breath when laying flat   '[]'$ Shortness of breath at rest   '[]'$ Shortness of breath with exertion. Vascular:  '[]'$ Pain in legs with walking   '[]'$ Pain in legs at rest   '[]'$ Pain in legs when laying flat   '[]'$ Claudication   '[]'$ Pain in feet when walking  '[]'$ Pain in feet at rest  '[]'$ Pain in feet when laying flat   '[x]'$ History of DVT   '[]'$ Phlebitis   '[]'$ Swelling in legs   '[]'$ Varicose veins   '[]'$ Non-healing ulcers Pulmonary:   '[]'$ Uses home oxygen   '[]'$ Productive cough   '[]'$ Hemoptysis   '[]'$ Wheeze  '[]'$ COPD   '[]'$ Asthma Neurologic:  '[]'$ Dizziness  '[]'$ Blackouts   '[]'$ Seizures   '[]'$ History of stroke   '[]'$ History of TIA  '[]'$ Aphasia   '[]'$ Temporary blindness   '[]'$ Dysphagia   '[]'$ Weakness or numbness in arms   '[]'$ Weakness or numbness in legs Musculoskeletal:  '[]'$ Arthritis   '[]'$ Joint swelling   '[]'$ Joint pain   '[]'$ Low back pain Hematologic:  '[]'$ Easy bruising  '[]'$ Easy bleeding   '[]'$ Hypercoagulable state   '[]'$ Anemic   Gastrointestinal:  '[]'$ Blood in  stool   '[]'$ Vomiting blood  '[]'$ Gastroesophageal reflux/heartburn   '[]'$ Abdominal pain Genitourinary:  '[]'$ Chronic kidney disease   '[]'$ Difficult urination  '[]'$ Frequent urination  '[]'$ Burning with urination   '[]'$ Hematuria Skin:  '[]'$ Rashes   '[]'$ Ulcers   '[]'$ Wounds Psychological:  '[]'$ History of anxiety   '[]'$  History of major depression.  Physical Examination  BP 108/66 (BP Location: Left Arm)   Pulse 60   Resp 16   Wt 281 lb (127.5 kg)   BMI 34.20 kg/m  Gen:  WD/WN, NAD Head: [place]/AT, No temporalis wasting. Ear/Nose/Throat: Hearing grossly intact, nares w/o erythema or drainage Eyes: Conjunctiva clear. Sclera non-icteric Neck: Supple.  Trachea midline Pulmonary:  Good air movement, no use of accessory muscles.  Cardiac: RRR, no JVD Vascular:  Vessel Right Left  Radial Palpable Palpable                           PT Palpable Palpable  DP Palpable Palpable   Gastrointestinal: soft, non-tender/non-distended. No guarding/reflex.  Musculoskeletal: M/S 5/5 throughout.  No deformity or atrophy. No significant LE edema. Neurologic: Sensation grossly intact in extremities.  Symmetrical.  Speech is fluent.  Psychiatric: Judgment intact, Mood & affect appropriate for pt's clinical situation. Dermatologic: No rashes or ulcers noted.  No cellulitis or open wounds.      Labs No results found for this or any previous visit (from the past 2160 hour(s)).  Radiology No results found.  Assessment/Plan  PE (pulmonary thromboembolism) ([place]) Had a pulmonary embolus and DVT without major provocation about 1 year ago.  Given his young age and relative high risk of recurrence given the unprovoked DVT, continued anticoagulation would be reasonable.  At this point, since we are 1 year out it would be reasonable to reduce the dose to 2.5 mg twice daily of Eliquis.  A new prescription will be sent in for this.  I will plan to see him back in 1 year.  DVT (deep venous thrombosis) ([place]) Had a pulmonary embolus and DVT without major provocation about 1 year ago.  Given his young age and relative high risk of recurrence given the unprovoked DVT, continued anticoagulation would be reasonable.  At this point, since we are 1 year out it would be reasonable to reduce the dose to 2.5 mg twice daily of Eliquis.  A new prescription will be sent in for this.  I will plan to see him back in 1 year.    Leotis Pain, MD  04/15/2022 12:14 PM    This note was created with Dragon medical transcription system.  Any errors from dictation are purely unintentional

## 2022-04-15 DIAGNOSIS — I82409 Acute embolism and thrombosis of unspecified deep veins of unspecified lower extremity: Secondary | ICD-10-CM | POA: Insufficient documentation

## 2022-04-15 MED ORDER — APIXABAN 2.5 MG PO TABS: 2.5000 mg | ORAL_TABLET | Freq: Two times a day (BID) | ORAL | 11 refills | Status: DC

## 2022-04-15 MED ORDER — APIXABAN 2.5 MG PO TABS: 2.5000 mg | ORAL_TABLET | Freq: Two times a day (BID) | ORAL | 1 refills | Status: DC

## 2022-04-15 NOTE — Assessment & Plan Note (Signed)
Had a pulmonary embolus and DVT without major provocation about 1 year ago.  Given his young age and relative high risk of recurrence given the unprovoked DVT, continued anticoagulation would be reasonable.  At this point, since we are 1 year out it would be reasonable to reduce the dose to 2.5 mg twice daily of Eliquis.  A new prescription will be sent in for this.  I will plan to see him back in 1 year.

## 2022-04-28 ENCOUNTER — Telehealth (INDEPENDENT_AMBULATORY_CARE_PROVIDER_SITE_OTHER): Payer: Self-pay

## 2022-04-28 NOTE — Telephone Encounter (Signed)
Patient left a message stating that his prescription Eliquis has changed to 2.5 and the copay will be $40. Patient has been on 5mg  and paying $10. I reached out to pharmacy and they informed that his copay will remain at $40 for with dosage through insurance. Patient was notified and will continue with prescribe dosage Eliquis 2.5mg  twice a day.

## 2022-05-15 ENCOUNTER — Encounter
Admission: RE | Disposition: A | Discharge status: Home / Self Care | Payer: Self-pay | Source: Home / Self Care | Attending: Gastroenterology

## 2022-05-15 ENCOUNTER — Ambulatory Visit: Payer: BC Managed Care – PPO | Admitting: Certified Registered Nurse Anesthetist

## 2022-05-15 ENCOUNTER — Ambulatory Visit
Admission: RE | Admit: 2022-05-15 | Discharge: 2022-05-15 | Disposition: A | Discharge status: Home / Self Care | Payer: BC Managed Care – PPO | Attending: Gastroenterology | Admitting: Gastroenterology

## 2022-05-15 DIAGNOSIS — Z86718 Personal history of other venous thrombosis and embolism: Secondary | ICD-10-CM | POA: Diagnosis not present

## 2022-05-15 DIAGNOSIS — Z09 Encounter for follow-up examination after completed treatment for conditions other than malignant neoplasm: Secondary | ICD-10-CM | POA: Insufficient documentation

## 2022-05-15 DIAGNOSIS — Z7901 Long term (current) use of anticoagulants: Secondary | ICD-10-CM | POA: Insufficient documentation

## 2022-05-15 DIAGNOSIS — Z1211 Encounter for screening for malignant neoplasm of colon: Secondary | ICD-10-CM

## 2022-05-15 DIAGNOSIS — K621 Rectal polyp: Secondary | ICD-10-CM | POA: Diagnosis not present

## 2022-05-15 DIAGNOSIS — D128 Benign neoplasm of rectum: Secondary | ICD-10-CM | POA: Diagnosis not present

## 2022-05-15 DIAGNOSIS — Z86711 Personal history of pulmonary embolism: Secondary | ICD-10-CM | POA: Insufficient documentation

## 2022-05-15 HISTORY — PX: COLONOSCOPY WITH PROPOFOL: SHX5780

## 2022-05-15 SURGERY — COLONOSCOPY WITH PROPOFOL
Anesthesia: General

## 2022-05-15 MED ORDER — LIDOCAINE HCL (PF) 2 % IJ SOLN
INTRAMUSCULAR | Status: AC
  Filled 2022-05-15: qty 5

## 2022-05-15 MED ORDER — PROPOFOL 10 MG/ML IV BOLUS
INTRAVENOUS | Status: DC | PRN
  Administered 2022-05-15: 80 mg via INTRAVENOUS
  Administered 2022-05-15: 30 mg via INTRAVENOUS

## 2022-05-15 MED ORDER — DEXMEDETOMIDINE HCL IN NACL 80 MCG/20ML IV SOLN
INTRAVENOUS | Status: DC | PRN
  Administered 2022-05-15: 8 ug via BUCCAL

## 2022-05-15 MED ORDER — PROPOFOL 500 MG/50ML IV EMUL
INTRAVENOUS | Status: DC | PRN
  Administered 2022-05-15: 160 ug/kg/min via INTRAVENOUS

## 2022-05-15 MED ORDER — SODIUM CHLORIDE 0.9 % IV SOLN
INTRAVENOUS | Status: DC
  Administered 2022-05-15: 20 mL/h via INTRAVENOUS

## 2022-05-15 MED ORDER — GLYCOPYRROLATE 0.2 MG/ML IJ SOLN
INTRAMUSCULAR | Status: AC
  Filled 2022-05-15: qty 1

## 2022-05-15 MED ORDER — DEXMEDETOMIDINE HCL IN NACL 80 MCG/20ML IV SOLN
INTRAVENOUS | Status: AC
  Filled 2022-05-15: qty 20

## 2022-05-15 MED ORDER — PROPOFOL 1000 MG/100ML IV EMUL
INTRAVENOUS | Status: AC
  Filled 2022-05-15: qty 100

## 2022-05-15 NOTE — Anesthesia Postprocedure Evaluation (Signed)
Anesthesia Post Note  Patient: Benjamin Dyer  Procedure(s) Performed: COLONOSCOPY WITH PROPOFOL  Patient location during evaluation: Endoscopy Anesthesia Type: General Level of consciousness: awake and alert Pain management: pain level controlled Vital Signs Assessment: post-procedure vital signs reviewed and stable Respiratory status: spontaneous breathing, nonlabored ventilation, respiratory function stable and patient connected to nasal cannula oxygen Cardiovascular status: blood pressure returned to baseline and stable Postop Assessment: no apparent nausea or vomiting Anesthetic complications: no  No notable events documented.   Last Vitals:  Vitals:   05/15/22 0901 05/15/22 0911  BP: 110/71 113/72  Pulse: (!) 47 (!) 46  Resp: (!) 21 17  Temp:    SpO2: 100% 96%    Last Pain:  Vitals:   05/15/22 0911  TempSrc:   PainSc: 0-No pain                 Stephanie Coup

## 2022-05-15 NOTE — Op Note (Signed)
[facility]astroenterology Patient Name: Benjamin Dyer Procedure Date: 05/15/2022 8:22 AM MRN: [id number] Account #: [id number] Date of Birth: 1971-01-21 Admit Type: Outpatient Age: 52 Room: [facility] ENDO ROOM 1 Gender: Male Note Status: Finalized Instrument Name: Nelda Marseille [id number] Procedure:             Colonoscopy Indications:           Screening for colorectal malignant neoplasm, This is                         the patient's first colonoscopy Providers:             Toney Reil MD, MD Referring MD:          Yehuda Mao. Birdie Sons (Referring MD) Medicines:             General Anesthesia Complications:         No immediate complications. Estimated blood loss: None. Procedure:             Pre-Anesthesia Assessment:                        - Prior to the procedure, a History and Physical was                         performed, and patient medications and allergies were                         reviewed. The patient is competent. The risks and                         benefits of the procedure and the sedation options and                         risks were discussed with the patient. All questions                         were answered and informed consent was obtained.                         Patient identification and proposed procedure were                         verified by the physician, the nurse, the                         anesthesiologist, the anesthetist and the technician                         in the pre-procedure area in the procedure room in the                         endoscopy suite. Mental Status Examination: alert and                         oriented. Airway Examination: normal oropharyngeal                         airway and neck mobility. Respiratory Examination:  clear to auscultation. CV Examination: normal.                         Prophylactic Antibiotics: The patient does not require                         prophylactic  antibiotics. Prior Anticoagulants: The                         patient has taken Eliquis (apixaban), last dose was 3                         days prior to procedure. ASA Grade Assessment: II - A                         patient with mild systemic disease. After reviewing                         the risks and benefits, the patient was deemed in                         satisfactory condition to undergo the procedure. The                         anesthesia plan was to use general anesthesia.                         Immediately prior to administration of medications,                         the patient was re-assessed for adequacy to receive                         sedatives. The heart rate, respiratory rate, oxygen                         saturations, blood pressure, adequacy of pulmonary                         ventilation, and response to care were monitored                         throughout the procedure. The physical status of the                         patient was re-assessed after the procedure.                        After obtaining informed consent, the colonoscope was                         passed under direct vision. Throughout the procedure,                         the patient's blood pressure, pulse, and oxygen                         saturations were monitored continuously. The  Colonoscope was introduced through the anus and                         advanced to the the cecum, identified by appendiceal                         orifice and ileocecal valve. The colonoscopy was                         performed without difficulty. The patient tolerated                         the procedure well. The quality of the bowel                         preparation was evaluated using the BBPS [facility] Bowel                         Preparation Scale) with scores of: Right Colon = 3,                         Transverse Colon = 3 and Left Colon = 3 (entire mucosa                          seen well with no residual staining, small fragments                         of stool or opaque liquid). The total BBPS score                         equals 9. The ileocecal valve, appendiceal orifice,                         and rectum were photographed. Findings:      The perianal and digital rectal examinations were normal. Pertinent       negatives include normal sphincter tone and no palpable rectal lesions.      A diminutive polyp was found in the rectum. The polyp was sessile. The       polyp was removed with a cold biopsy forceps. Resection and retrieval       were complete. Estimated blood loss: none.      The retroflexed view of the distal rectum and anal verge was normal and       showed no anal or rectal abnormalities.      The exam was otherwise without abnormality. Impression:            - One diminutive polyp in the rectum, removed with a                         cold biopsy forceps. Resected and retrieved.                        - The distal rectum and anal verge are normal on                         retroflexion view.                        -  The examination was otherwise normal. Recommendation:        - Discharge patient to home (with escort).                        - Resume previous diet today.                        - Continue present medications.                        - Await pathology results.                        - Resume Eliquis (apixaban) today at prior dose. Refer                         to managing physician for further adjustment of                         therapy.                        - Repeat colonoscopy in 7-10 years for surveillance                         based on pathology results. Procedure Code(s):     --- Professional ---                        [phone], Colonoscopy, flexible; with biopsy, single or                         multiple Diagnosis Code(s):     --- Professional ---                        Z12.11, Encounter for screening for  malignant neoplasm                         of colon                        D12.8, Benign neoplasm of rectum CPT copyright 2022 American Medical Association. All rights reserved. The codes documented in this report are preliminary and upon coder review may  be revised to meet current compliance requirements. Dr. Libby Maw Toney Reil MD, MD 05/15/2022 8:50:12 AM This report has been signed electronically. Number of Addenda: 0 Note Initiated On: 05/15/2022 8:22 AM Scope Withdrawal Time: 0 hours 10 minutes 8 seconds  Total Procedure Duration: 0 hours 14 minutes 6 seconds  Estimated Blood Loss:  Estimated blood loss: none.      [facility]

## 2022-05-15 NOTE — Anesthesia Procedure Notes (Signed)
Date/Time: 05/15/2022 8:29 AM  Performed by: Malva Cogan, CRNAPre-anesthesia Checklist: Patient identified, Emergency Drugs available, Suction available, Patient being monitored and Timeout performed Patient Re-evaluated:Patient Re-evaluated prior to induction Oxygen Delivery Method: Nasal cannula Induction Type: IV induction Placement Confirmation: CO2 detector and positive ETCO2

## 2022-05-15 NOTE — H&P (Signed)
Arlyss Repress, MD [address]  [address]  [place], [place] [address]  Main: [phone]  Fax: [phone] Pager: [phone]  Primary Care Physician:  Glori Luis, MD Primary Gastroenterologist:  Dr. Arlyss Repress  Pre-Procedure History & Physical: HPI:  Benjamin Dyer is a 52 y.o. male is here for an colonoscopy.   Past Medical History:  Diagnosis Date   DVT (deep venous thrombosis) ([facility])    Pulmonary embolism ([facility])     Past Surgical History:  Procedure Laterality Date   PULMONARY THROMBECTOMY Bilateral 03/26/2021   Procedure: PULMONARY THROMBECTOMY;  Surgeon: Annice Needy, MD;  Location: [facility];  Service: Cardiovascular;  Laterality: Bilateral;    Prior to Admission medications   Medication Sig Start Date End Date Taking? Authorizing Provider  apixaban (ELIQUIS) 2.5 MG TABS tablet Take 1 tablet (2.5 mg total) by mouth 2 (two) times daily. 04/15/22  Yes Dew, Marlow Baars, MD  ELIQUIS 5 MG TABS tablet TAKE 2TABS 2 (TWO) TIMES DAILY. AND FROM 04/03/2021 START TAKING 5 MG (1 TAB) TWO TIMES A DAY (TWICE) 03/23/22  Yes Georgiana Spinner, NP  Multiple Vitamin (MULTIVITAMIN) tablet Take 1 tablet by mouth daily.   Yes [provider]    Allergies as of 02/12/2022 - Review Complete 02/09/2022  Allergen Reaction Noted   Wound dressing adhesive Rash 03/31/2021    Family History  Problem Relation Age of Onset   Arthritis Mother    Prostate cancer Father    Mental illness Sister    Diabetes Maternal Uncle    Stroke Paternal Aunt    Leukemia Paternal Uncle     Social History   Socioeconomic History   Marital status: Married    Spouse name: Not on file   Number of children: Not on file   Years of education: Not on file   Highest education level: Not on file  Occupational History   Not on file  Tobacco Use   Smoking status: Never   Smokeless tobacco: Never  Vaping Use   Vaping Use: Never used  Substance and Sexual Activity   Alcohol  use: No   Drug use: No   Sexual activity: Not on file  Other Topics Concern   Not on file  Social History Narrative   Not on file   Social Determinants of Health   Financial Resource Strain: Not on file  Food Insecurity: Not on file  Transportation Needs: Not on file  Physical Activity: Not on file  Stress: Not on file  Social Connections: Not on file  Intimate Partner Violence: Not on file    Review of Systems: See HPI, otherwise negative ROS  Physical Exam: BP 117/83   Pulse (!) 53   Temp (!) 96.5 F (35.8 C) (Temporal)   Resp 20   Ht 6' 3.75" (1.924 m)   Wt 124.8 kg   SpO2 100%   BMI 33.72 kg/m  General:   Alert,  pleasant and cooperative in NAD Head:  Normocephalic and atraumatic. Neck:  Supple; no masses or thyromegaly. Lungs:  Clear throughout to auscultation.    Heart:  Regular rate and rhythm. Abdomen:  Soft, nontender and nondistended. Normal bowel sounds, without guarding, and without rebound.   Neurologic:  Alert and  oriented x4;  grossly normal neurologically.  Impression/Plan: Benjamin Dyer is here for an colonoscopy to be performed for colon cancer screening  Risks, benefits, limitations, and alternatives regarding  colonoscopy have been reviewed with the  patient.  Questions have been answered.  All parties agreeable.   Lannette Donath, MD  05/15/2022, 7:56 AM

## 2022-05-15 NOTE — Transfer of Care (Signed)
Immediate Anesthesia Transfer of Care Note  Patient: Benjamin Dyer  Procedure(s) Performed: COLONOSCOPY WITH PROPOFOL  Patient Location: PACU  Anesthesia Type:General  Level of Consciousness: drowsy  Airway & Oxygen Therapy: Patient Spontanous Breathing  Post-op Assessment: Report given to RN and Post -op Vital signs reviewed and stable  Post vital signs: Reviewed and stable  Last Vitals:  Vitals Value Taken Time  BP    Temp    Pulse 60 05/15/22 0851  Resp 22 05/15/22 0851  SpO2 96 % 05/15/22 0851  Vitals shown include unvalidated device data.  Last Pain:  Vitals:   05/15/22 0714  TempSrc: Temporal  PainSc: 0-No pain         Complications: No notable events documented.

## 2022-05-15 NOTE — Anesthesia Preprocedure Evaluation (Signed)
Anesthesia Evaluation  Patient identified by MRN, date of birth, ID band Patient awake    Reviewed: Allergy & Precautions, NPO status , Patient's Chart, lab work & pertinent test results  Airway Mallampati: III  TM Distance: >3 FB Neck ROM: full    Dental  (+) Chipped   Pulmonary neg pulmonary ROS, neg shortness of breath   Pulmonary exam normal        Cardiovascular + DVT  Normal cardiovascular exam     Neuro/Psych negative neurological ROS  negative psych ROS   GI/Hepatic negative GI ROS, Neg liver ROS,,,  Endo/Other  negative endocrine ROS    Renal/GU negative Renal ROS  negative genitourinary   Musculoskeletal   Abdominal   Peds  Hematology negative hematology ROS (+)   Anesthesia Other Findings Patient with a PMH of DVT and PE. States his last one was about a year ago. States that he has taken his eliquis until Monday of this week. Denies any shortness of breath, chest pain.   Past Medical History: No date: DVT (deep venous thrombosis) ([facility]) No date: Pulmonary embolism [facility])  Past Surgical History: 03/26/2021: PULMONARY THROMBECTOMY; Bilateral     Comment:  Procedure: PULMONARY THROMBECTOMY;  Surgeon: Annice Needy, MD;  Location: [facility] INVASIVE CV [facility];  Service:               Cardiovascular;  Laterality: Bilateral;  BMI    Body Mass Index: 33.72 kg/m      Reproductive/Obstetrics negative OB ROS                             Anesthesia Physical Anesthesia Plan  ASA: 2  Anesthesia Plan: General   Post-op Pain Management: Minimal or no pain anticipated   Induction: Intravenous  PONV Risk Score and Plan: 3 and Propofol infusion, TIVA and Ondansetron  Airway Management Planned: Nasal Cannula  Additional Equipment: None  Intra-op Plan:   Post-operative Plan:   Informed Consent: I have reviewed the patients History and Physical, chart, labs and discussed  the procedure including the risks, benefits and alternatives for the proposed anesthesia with the patient or authorized representative who has indicated his/her understanding and acceptance.     Dental advisory given  Plan Discussed with: CRNA and Surgeon  Anesthesia Plan Comments: (Discussed risks of anesthesia with patient, including possibility of difficulty with spontaneous ventilation under anesthesia necessitating airway intervention, PONV, and rare risks such as cardiac or respiratory or neurological events, and allergic reactions. Discussed the role of CRNA in patient's perioperative care. Patient understands.)       Anesthesia Quick Evaluation

## 2022-05-18 ENCOUNTER — Encounter: Payer: Self-pay | Admitting: Gastroenterology

## 2022-05-18 LAB — SURGICAL PATHOLOGY

## 2022-05-19 ENCOUNTER — Encounter: Payer: Self-pay | Admitting: Gastroenterology

## 2022-06-10 ENCOUNTER — Encounter: Payer: Self-pay | Admitting: Gastroenterology

## 2022-08-17 ENCOUNTER — Ambulatory Visit (INDEPENDENT_AMBULATORY_CARE_PROVIDER_SITE_OTHER): Payer: BC Managed Care – PPO | Admitting: Family Medicine

## 2022-08-17 ENCOUNTER — Encounter: Payer: Self-pay | Admitting: Family Medicine

## 2022-08-17 VITALS — BP 118/74 | HR 51 | Temp 97.5°F | Ht 75.75 in | Wt 281.0 lb

## 2022-08-17 DIAGNOSIS — I2699 Other pulmonary embolism without acute cor pulmonale: Secondary | ICD-10-CM

## 2022-08-17 DIAGNOSIS — L821 Other seborrheic keratosis: Secondary | ICD-10-CM | POA: Insufficient documentation

## 2022-08-17 DIAGNOSIS — E669 Obesity, unspecified: Secondary | ICD-10-CM

## 2022-08-17 DIAGNOSIS — Z13220 Encounter for screening for lipoid disorders: Secondary | ICD-10-CM

## 2022-08-17 DIAGNOSIS — Z6834 Body mass index (BMI) 34.0-34.9, adult: Secondary | ICD-10-CM

## 2022-08-17 DIAGNOSIS — Z0001 Encounter for general adult medical examination with abnormal findings: Secondary | ICD-10-CM | POA: Diagnosis not present

## 2022-08-17 DIAGNOSIS — Z125 Encounter for screening for malignant neoplasm of prostate: Secondary | ICD-10-CM | POA: Diagnosis not present

## 2022-08-17 LAB — PSA: PSA: 0.43 ng/mL (ref 0.10–4.00)

## 2022-08-17 LAB — COMPREHENSIVE METABOLIC PANEL
ALT: 33 U/L (ref 0–53)
AST: 26 U/L (ref 0–37)
Albumin: 4.3 g/dL (ref 3.5–5.2)
Alkaline Phosphatase: 77 U/L (ref 39–117)
BUN: 16 mg/dL (ref 6–23)
CO2: 26 mEq/L (ref 19–32)
Calcium: 9.9 mg/dL (ref 8.4–10.5)
Chloride: 104 mEq/L (ref 96–112)
Creatinine, Ser: 1.3 mg/dL (ref 0.40–1.50)
GFR: 64.18 mL/min (ref >60.00)
Glucose, Bld: 104 mg/dL — ABNORMAL HIGH (ref 70–99)
Potassium: 4.3 mEq/L (ref 3.5–5.1)
Sodium: 138 mEq/L (ref 135–145)
Total Bilirubin: 1 mg/dL (ref 0.2–1.2)
Total Protein: 6.8 g/dL (ref 6.0–8.3)

## 2022-08-17 LAB — CBC
HCT: 45.4 % (ref 39.0–52.0)
Hemoglobin: 15.4 g/dL (ref 13.0–17.0)
MCHC: 33.9 g/dL (ref 30.0–36.0)
MCV: 90 fl (ref 78.0–100.0)
Platelets: 224 10*3/uL (ref 150.0–400.0)
RBC: 5.05 Mil/uL (ref 4.22–5.81)
RDW: 13.3 % (ref 11.5–15.5)
WBC: 5.2 10*3/uL (ref 4.0–10.5)

## 2022-08-17 LAB — LIPID PANEL
Cholesterol: 198 mg/dL (ref 0–200)
HDL: 56.1 mg/dL (ref >39.00)
LDL Cholesterol: 128 mg/dL — ABNORMAL HIGH (ref 0–99)
NonHDL: 142.14
Total CHOL/HDL Ratio: 4
Triglycerides: 69 mg/dL (ref 0.0–149.0)
VLDL: 13.8 mg/dL (ref 0.0–40.0)

## 2022-08-17 LAB — HEMOGLOBIN A1C: Hgb A1c MFr Bld: 5.3 % (ref 4.6–6.5)

## 2022-08-17 NOTE — Assessment & Plan Note (Signed)
Physical exam completed.  I suspect the patient's weight gain is related to increasing muscle mass.  He is exercising quite a bit and is lifting weights.  The weights he is lifting are not excessive though I suspect this is building more muscle for him.  Discussed trying to reduce the volume he eats at dinner.  Discussed may be doing more cardio than weightlifting.  PSA completed for prostate cancer screening.  Patient declines COVID vaccination.  I encouraged him to get the Shingrix vaccine and he will check with his insurance on coverage.  He declines hepatitis C and HIV screening given that he is low risk.  I encouraged patient to see a dentist and an eye doctor.  Lab work as outlined.

## 2022-08-17 NOTE — Progress Notes (Signed)
Benjamin Alar, MD Phone: [phone]  Benjamin Dyer is a 52 y.o. male who presents today for CPE.  Diet: limiting bread intake, drinking more water, eating more protein, typically has a pack of oatmeal for breakfast with blueberries, will have a cereal bar and a banana for a snack, he will take some leftovers or have a grilled chicken and egg biscuit for lunch, and then will typically have a protein, vegetables, and a carbohydrate at dinner, he does usually have seconds for dinner Exercise: Lifts weights and runs at least 4 days a week.  Sometimes he exercises daily. Colonoscopy: 05/15/22 10 year recall Prostate cancer screening: due Family history-  Prostate cancer: father  Colon cancer: no Vaccines-   Flu: out of season  Tetanus: UTD  Shingles: due  COVID19: declines HIV screening: declines Hep C Screening: declines Tobacco use: no Alcohol use: no Illicit Drug use: no Dentist: no Ophthalmology: no  Skin lesion: Patient reports a skin lesion on his back that seems to have gotten larger over time.   Active Ambulatory Problems    Diagnosis Date Noted   Encounter for general adult medical examination with abnormal findings 12/23/2015   Asymmetrical hearing loss, right 03/06/2014   Exertional dyspnea 06/30/2018   Rib pain 06/30/2018   Skin lesion 06/30/2018   Back strain 06/30/2019   PE (pulmonary thromboembolism) (HCC) 03/25/2021   Right knee pain 08/08/2021   DVT (deep venous thrombosis) (HCC) 04/15/2022   Rectal polyp 05/15/2022   Screening for colon cancer 05/15/2022   Seborrheic keratoses 08/17/2022   Resolved Ambulatory Problems    Diagnosis Date Noted   No Resolved Ambulatory Problems   Past Medical History:  Diagnosis Date   Pulmonary embolism (HCC)     Family History  Problem Relation Age of Onset   Arthritis Mother    Prostate cancer Father    Mental illness Sister    Diabetes Maternal Uncle    Stroke Paternal Aunt    Leukemia Paternal Uncle      Social History   Socioeconomic History   Marital status: Married    Spouse name: Not on file   Number of children: Not on file   Years of education: Not on file   Highest education level: Not on file  Occupational History   Not on file  Tobacco Use   Smoking status: Never   Smokeless tobacco: Never  Vaping Use   Vaping status: Never Used  Substance and Sexual Activity   Alcohol use: No   Drug use: No   Sexual activity: Not on file  Other Topics Concern   Not on file  Social History Narrative   Not on file   Social Determinants of Health   Financial Resource Strain: Not on file  Food Insecurity: Not on file  Transportation Needs: Not on file  Physical Activity: Not on file  Stress: Not on file  Social Connections: Not on file  Intimate Partner Violence: Not on file    ROS  General:  Negative for nexplained weight loss, fever Skin: Negative for new or changing mole, sore that won't heal HEENT: Negative for trouble hearing, trouble seeing, ringing in ears, mouth sores, hoarseness, change in voice, dysphagia. CV:  Negative for chest pain, dyspnea, edema, palpitations Resp: Negative for cough, dyspnea, hemoptysis GI: Negative for nausea, vomiting, diarrhea, constipation, abdominal pain, melena, hematochezia. GU: Negative for dysuria, incontinence, urinary hesitance, hematuria, vaginal or penile discharge, polyuria, sexual difficulty, lumps in testicle or breasts MSK: Negative for muscle  cramps or aches, joint pain or swelling Neuro: Negative for headaches, weakness, numbness, dizziness, passing out/fainting Psych: Negative for depression, anxiety, memory problems  Objective  Physical Exam Vitals:   08/17/22 0853  BP: 118/74  Pulse: (!) 51  Temp: (!) 97.5 F (36.4 C)  SpO2: 98%    BP Readings from Last 3 Encounters:  08/17/22 118/74  05/15/22 113/72  04/14/22 108/66   Wt Readings from Last 3 Encounters:  08/17/22 281 lb (127.5 kg)  05/15/22 275 lb  3.2 oz (124.8 kg)  04/14/22 281 lb (127.5 kg)    Physical Exam Constitutional:      General: He is not in acute distress.    Appearance: He is not diaphoretic.  HENT:     Head: Normocephalic and atraumatic.  Cardiovascular:     Rate and Rhythm: Normal rate and regular rhythm.     Heart sounds: Normal heart sounds.  Pulmonary:     Effort: Pulmonary effort is normal.     Breath sounds: Normal breath sounds.  Abdominal:     General: Bowel sounds are normal. There is no distension.     Palpations: Abdomen is soft.     Tenderness: There is no abdominal tenderness.  Musculoskeletal:     Right lower leg: No edema.     Left lower leg: No edema.  Lymphadenopathy:     Cervical: No cervical adenopathy.  Skin:    General: Skin is warm and dry.  Neurological:     Mental Status: He is alert.  Psychiatric:        Mood and Affect: Mood normal.     Appearance seborrheic keratosis, patient does note he scratched it on a door at 1 point and that it irritated  Assessment/Plan:   Encounter for general adult medical examination with abnormal findings Assessment & Plan: Physical exam completed.  I suspect the patient's weight gain is related to increasing muscle mass.  He is exercising quite a bit and is lifting weights.  The weights he is lifting are not excessive though I suspect this is building more muscle for him.  Discussed trying to reduce the volume he eats at dinner.  Discussed may be doing more cardio than weightlifting.  PSA completed for prostate cancer screening.  Patient declines COVID vaccination.  I encouraged him to get the Shingrix vaccine and he will check with his insurance on coverage.  He declines hepatitis C and HIV screening given that he is low risk.  I encouraged patient to see a dentist and an eye doctor.  Lab work as outlined.   PE (pulmonary thromboembolism) (HCC) -     CBC  Class 1 obesity without serious comorbidity with body mass index (BMI) of 34.0 to 34.9 in  adult, unspecified obesity type -     Hemoglobin A1c  Lipid screening -     Comprehensive metabolic panel -     Lipid panel  Prostate cancer screening -     PSA  Seborrheic keratoses Assessment & Plan: Skin lesion on back appears to be a seborrheic keratosis.  We will refer to dermatology to consider removal.  Orders: -     Ambulatory referral to Dermatology    Return in about 1 year (around 08/17/2023) for physical.   Benjamin Alar, MD [facility]

## 2022-08-17 NOTE — Assessment & Plan Note (Signed)
Skin lesion on back appears to be a seborrheic keratosis.  We will refer to dermatology to consider removal.

## 2022-09-19 IMAGING — US US ABDOMEN LIMITED
1 series · 14 of 25 positions shown · non-contrast
Comparison: None.

CLINICAL DATA: Right upper quadrant pain.

EXAM:
ULTRASOUND ABDOMEN LIMITED RIGHT UPPER QUADRANT

[Series 1: us abdomen limited ruq (liver/gb) · 14 of 41 slices shown]
[im 1/41]
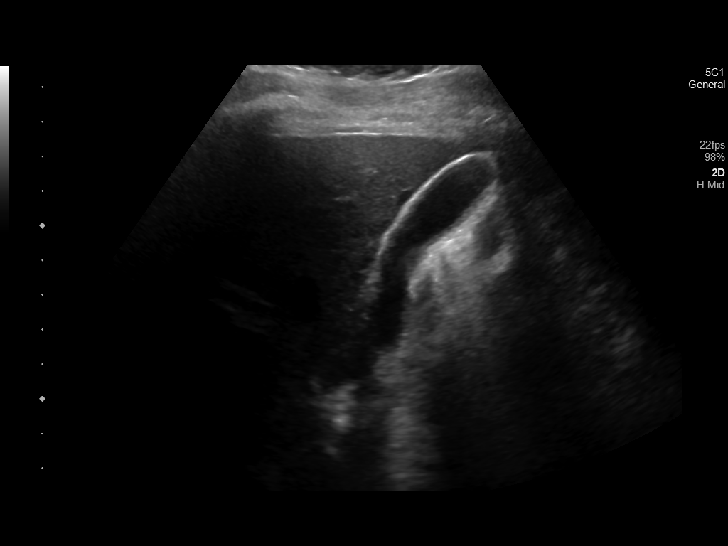
[im 4/41]
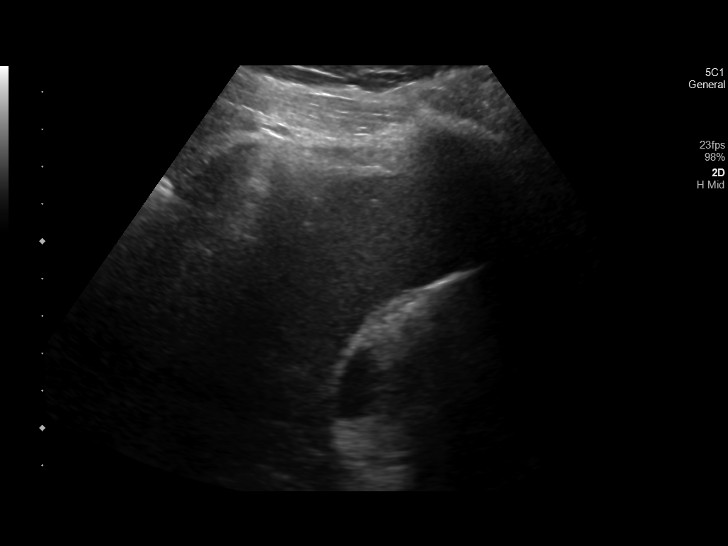
[im 7/41]
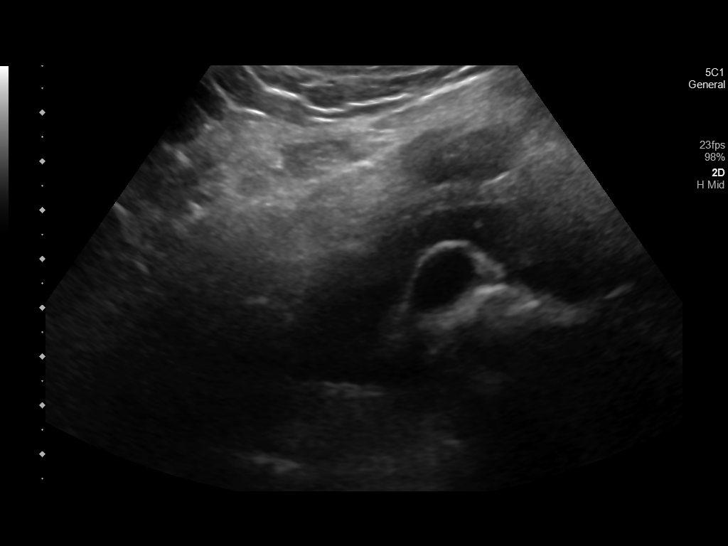
[im 11/41]
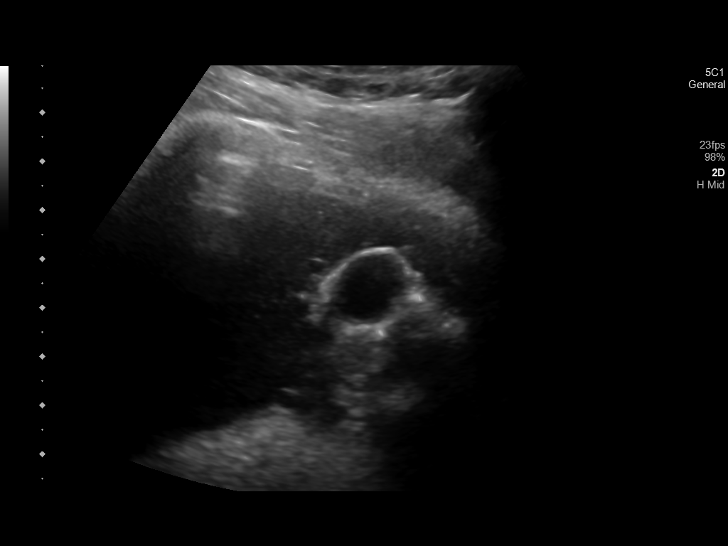
[im 14/41]
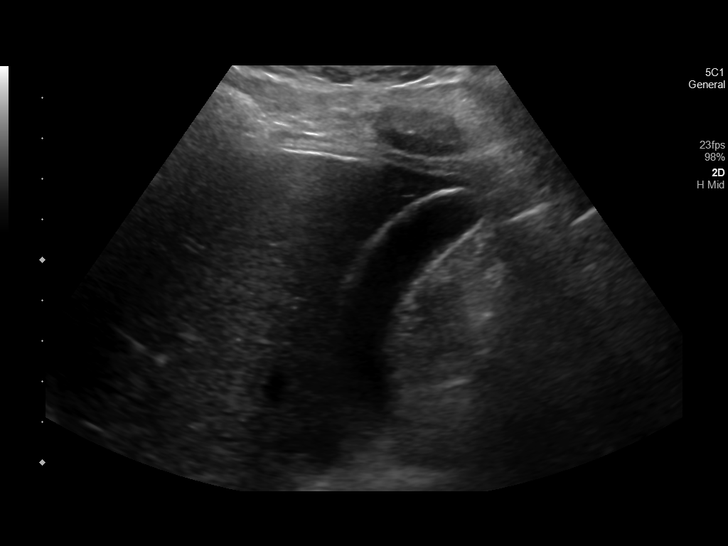
[im 16/41]
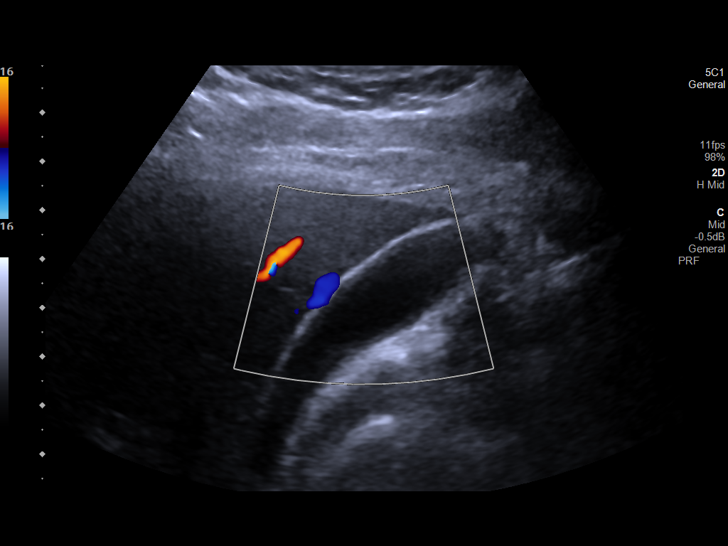
[im 19/41]
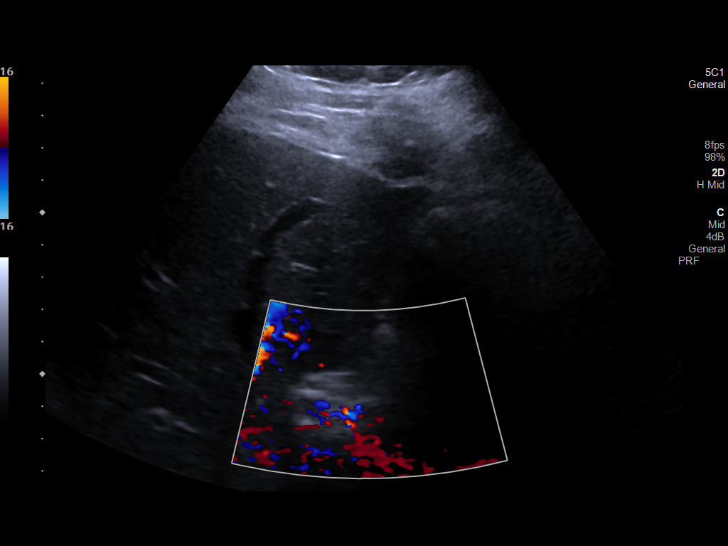
[im 22/41]
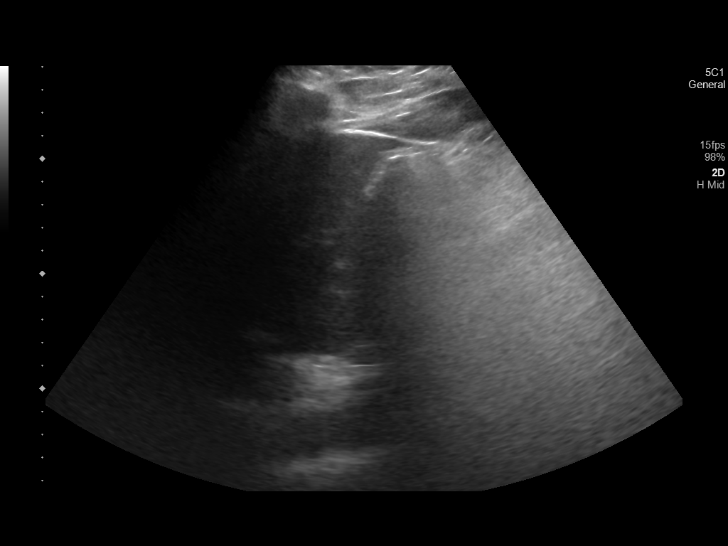
[im 26/41]
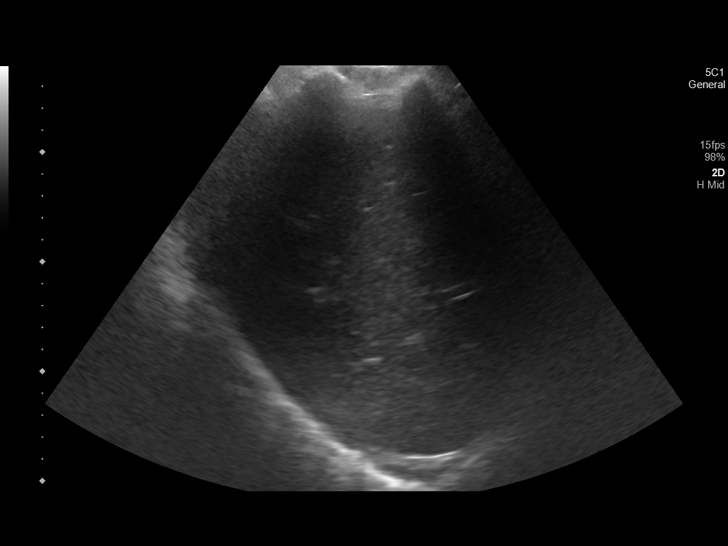
[im 27/41]
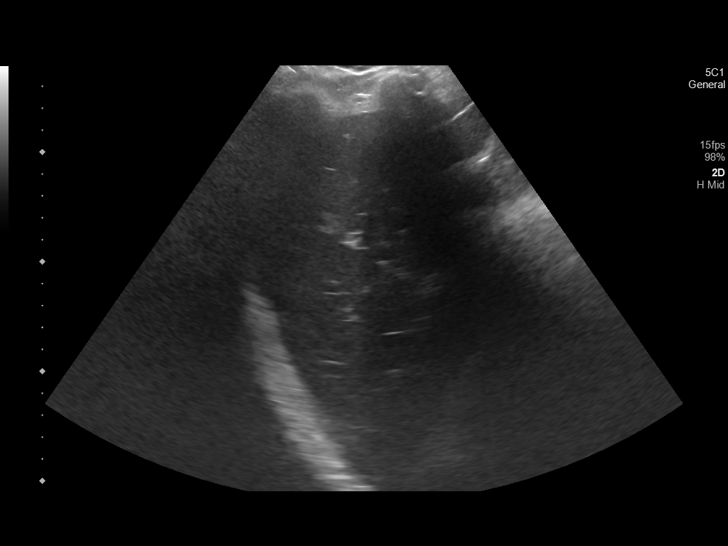
[im 31/41]
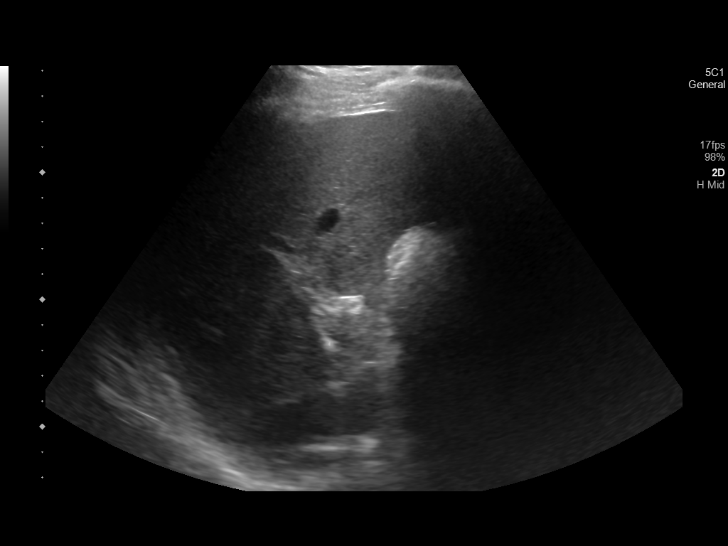
[im 34/41]
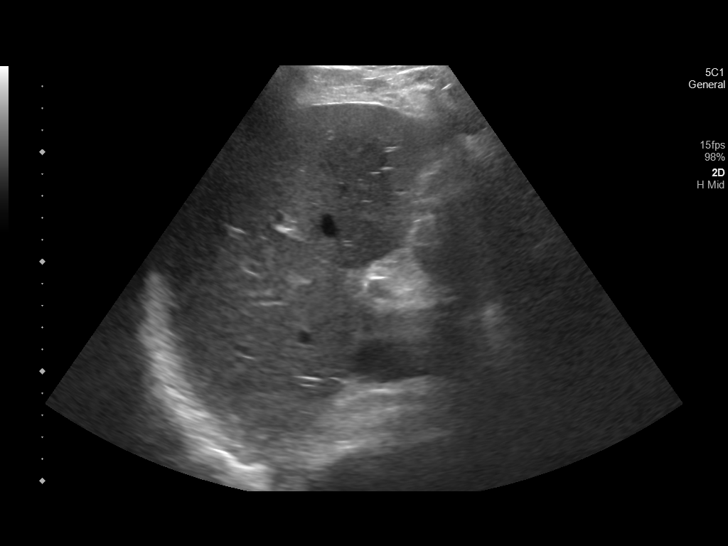
[im 37/41]
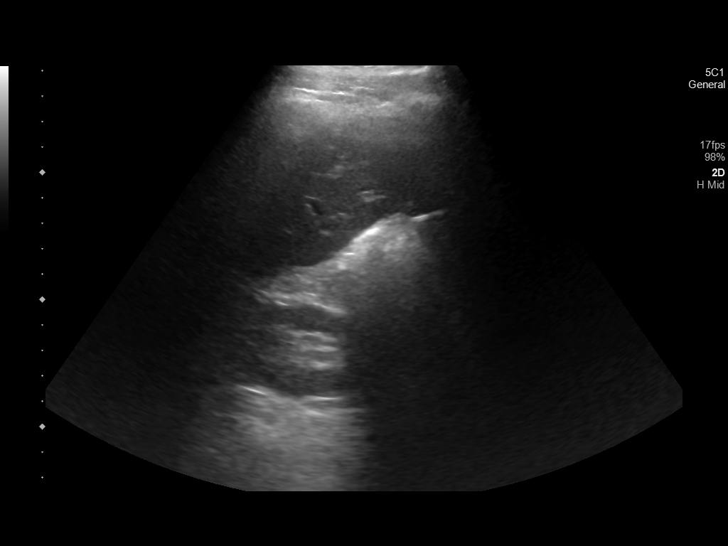
[im 41/41]
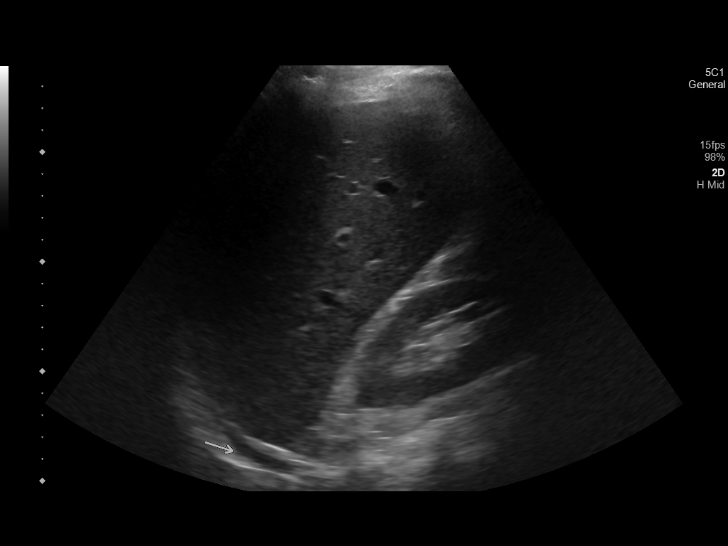

[14 of 25 positions shown; findings below may reference images not displayed]

FINDINGS: Gallbladder:

The gallbladder is mildly contracted. No gallstones or wall
thickening visualized (2.8 mm). No sonographic Murphy sign noted by
sonographer.

Common bile duct:

Diameter: 3.3 mm

Liver:

No focal lesion identified. Within normal limits in parenchymal
echogenicity. Portal vein is patent on color Doppler imaging with
normal direction of blood flow towards the liver.

Other: A trace amount of pleural fluid is suspected on the right.
IMPRESSION: 1. Trace right pleural effusion.
2. Otherwise, unremarkable right upper quadrant ultrasound.

## 2023-04-13 ENCOUNTER — Ambulatory Visit (INDEPENDENT_AMBULATORY_CARE_PROVIDER_SITE_OTHER): Payer: BC Managed Care – PPO | Admitting: Vascular Surgery

## 2023-04-13 ENCOUNTER — Encounter (INDEPENDENT_AMBULATORY_CARE_PROVIDER_SITE_OTHER): Payer: Self-pay | Admitting: Vascular Surgery

## 2023-04-13 VITALS — BP 127/78 | HR 46 | Resp 16 | Wt 292.0 lb

## 2023-04-13 DIAGNOSIS — I82531 Chronic embolism and thrombosis of right popliteal vein: Secondary | ICD-10-CM

## 2023-04-13 DIAGNOSIS — I2699 Other pulmonary embolism without acute cor pulmonale: Secondary | ICD-10-CM

## 2023-04-13 NOTE — Progress Notes (Unsigned)
 MRN : [id number]  Benjamin Dyer is a 53 y.o. (06-11-1970) male who presents with chief complaint of  Chief Complaint  Patient presents with   Follow-up    62yr follow up  .  History of Present Illness: Patient returns today in follow up of his previous DVT and PE.  He is 2 years status post pulmonary thrombectomy for submassive pulmonary embolus.  He is doing well.  He has been maintained for the past year on 2.5 mg twice daily of Eliquis without any recurrent thromboembolic symptoms.  His legs are fine with no pain or swelling.  No chest pain or shortness of breath.  He has had some bleeding issues from what sounds like either hemorrhoidal or other sort of GI issues that have become more bothersome to him over the past several months.  Current Outpatient Medications  Medication Sig Dispense Refill   apixaban (ELIQUIS) 2.5 MG TABS tablet Take 1 tablet (2.5 mg total) by mouth 2 (two) times daily. 60 tablet 11   Multiple Vitamin (MULTIVITAMIN) tablet Take 1 tablet by mouth daily.     ELIQUIS 5 MG TABS tablet TAKE 2TABS 2 (TWO) TIMES DAILY. AND FROM 04/03/2021 START TAKING 5 MG (1 TAB) TWO TIMES A DAY (TWICE) (Patient not taking: Reported on 04/13/2023) 60 tablet 3   No current facility-administered medications for this visit.    Past Medical History:  Diagnosis Date   DVT (deep venous thrombosis) (HCC)    Pulmonary embolism (HCC)     Past Surgical History:  Procedure Laterality Date   COLONOSCOPY WITH PROPOFOL N/A 05/15/2022   Procedure: COLONOSCOPY WITH PROPOFOL;  Surgeon: Toney Reil, MD;  Location: [facility] ENDOSCOPY;  Service: Gastroenterology;  Laterality: N/A;   PULMONARY THROMBECTOMY Bilateral 03/26/2021   Procedure: PULMONARY THROMBECTOMY;  Surgeon: Annice Needy, MD;  Location: [facility] INVASIVE CV LAB;  Service: Cardiovascular;  Laterality: Bilateral;     Social History   Tobacco Use   Smoking status: Never   Smokeless tobacco: Never  Vaping Use   Vaping status:  Never Used  Substance Use Topics   Alcohol use: No   Drug use: No       Family History  Problem Relation Age of Onset   Arthritis Mother    Prostate cancer Father    Mental illness Sister    Diabetes Maternal Uncle    Stroke Paternal Aunt    Leukemia Paternal Uncle      Allergies  Allergen Reactions   Wound Dressing Adhesive Rash     REVIEW OF SYSTEMS (Negative unless checked)  Constitutional: [] Weight loss  [] Fever  [] Chills Cardiac: [] Chest pain   [] Chest pressure   [] Palpitations   [] Shortness of breath when laying flat   [] Shortness of breath at rest   [] Shortness of breath with exertion. Vascular:  [] Pain in legs with walking   [] Pain in legs at rest   [] Pain in legs when laying flat   [] Claudication   [] Pain in feet when walking  [] Pain in feet at rest  [] Pain in feet when laying flat   [x] History of DVT   [] Phlebitis   [] Swelling in legs   [] Varicose veins   [] Non-healing ulcers Pulmonary:   [] Uses home oxygen   [] Productive cough   [] Hemoptysis   [] Wheeze  [] COPD   [] Asthma Neurologic:  [] Dizziness  [] Blackouts   [] Seizures   [] History of stroke   [] History of TIA  [] Aphasia   [] Temporary blindness   [] Dysphagia   [] Weakness  or numbness in arms   [] Weakness or numbness in legs Musculoskeletal:  [] Arthritis   [] Joint swelling   [] Joint pain   [] Low back pain Hematologic:  [] Easy bruising  [] Easy bleeding   [] Hypercoagulable state   [] Anemic   Gastrointestinal:  [] Blood in stool   [] Vomiting blood  [] Gastroesophageal reflux/heartburn   [] Abdominal pain Genitourinary:  [] Chronic kidney disease   [] Difficult urination  [] Frequent urination  [] Burning with urination   [] Hematuria Skin:  [] Rashes   [] Ulcers   [] Wounds Psychological:  [] History of anxiety   []  History of major depression.  Physical Examination  BP 127/78   Pulse (!) 46   Resp 16   Wt 292 lb (132.5 kg)   BMI 35.78 kg/m  Gen:  WD/WN, NAD Head: [place]/AT, No temporalis wasting. Ear/Nose/Throat: Hearing  grossly intact, nares w/o erythema or drainage Eyes: Conjunctiva clear. Sclera non-icteric Neck: Supple.  Trachea midline Pulmonary:  Good air movement, no use of accessory muscles.  Cardiac: RRR, no JVD Vascular:  Vessel Right Left  Radial Palpable Palpable                          PT Palpable Palpable  DP Palpable Palpable   Gastrointestinal: soft, non-tender/non-distended. No guarding/reflex.  Musculoskeletal: M/S 5/5 throughout.  No deformity or atrophy. No edema. Neurologic: Sensation grossly intact in extremities.  Symmetrical.  Speech is fluent.  Psychiatric: Judgment intact, Mood & affect appropriate for pt's clinical situation. Dermatologic: No rashes or ulcers noted.  No cellulitis or open wounds.      Labs No results found for this or any previous visit (from the past 2160 hours).  Radiology No results found.  Assessment/Plan  PE (pulmonary thromboembolism) (HCC) He is doing well and has no recurrent thromboembolic symptoms.  Over the past year, he had been on a prophylactic dose of Eliquis but has noticed some increased bleeding per rectum.  At this point, he is interested in stopping anticoagulation if that is reasonable.  We discussed traditionally, aspirin therapy alone was reasonable after 1 year of anticoagulation.  There is a lower risk of bleeding with aspirin.  There is a slightly higher risk of recurrent thromboembolic issues.  He understands this and desires to transition to 81 mg of aspirin a day.  He will stop the Eliquis at this time.  Follow-up in 1 year.  DVT (deep venous thrombosis) (HCC) He is doing well and has no recurrent thromboembolic symptoms.  Over the past year, he had been on a prophylactic dose of Eliquis but has noticed some increased bleeding per rectum.  At this point, he is interested in stopping anticoagulation if that is reasonable.  We discussed traditionally, aspirin therapy alone was reasonable after 1 year of anticoagulation.   There is a lower risk of bleeding with aspirin.  There is a slightly higher risk of recurrent thromboembolic issues.  He understands this and desires to transition to 81 mg of aspirin a day.  He will stop the Eliquis at this time.  Follow-up in 1 year.    [name], MD  04/15/2023 11:39 AM    This note was created with Dragon medical transcription system.  Any errors from dictation are purely unintentional

## 2023-04-15 NOTE — Assessment & Plan Note (Signed)
 He is doing well and has no recurrent thromboembolic symptoms.  Over the past year, he had been on a prophylactic dose of Eliquis but has noticed some increased bleeding per rectum.  At this point, he is interested in stopping anticoagulation if that is reasonable.  We discussed traditionally, aspirin therapy alone was reasonable after 1 year of anticoagulation.  There is a lower risk of bleeding with aspirin.  There is a slightly higher risk of recurrent thromboembolic issues.  He understands this and desires to transition to 81 mg of aspirin a day.  He will stop the Eliquis at this time.  Follow-up in 1 year.

## 2023-04-19 ENCOUNTER — Telehealth: Payer: Self-pay | Admitting: Family Medicine

## 2023-04-19 NOTE — Telephone Encounter (Signed)
 Dr Birdie Sons is no longer at this location. Please call the office to schedule a Transfer of Care to either Dr Charlann Lange, Darleen Crocker or Kara Dies, NP. Purvis Sheffield, please schedule a TOC visit for this patient. [facility]   Thank you

## 2023-04-27 ENCOUNTER — Other Ambulatory Visit (INDEPENDENT_AMBULATORY_CARE_PROVIDER_SITE_OTHER): Payer: Self-pay | Admitting: Vascular Surgery

## 2023-05-03 ENCOUNTER — Other Ambulatory Visit (INDEPENDENT_AMBULATORY_CARE_PROVIDER_SITE_OTHER): Payer: Self-pay

## 2023-05-03 ENCOUNTER — Telehealth (INDEPENDENT_AMBULATORY_CARE_PROVIDER_SITE_OTHER): Payer: Self-pay | Admitting: Vascular Surgery

## 2023-05-03 MED ORDER — APIXABAN 2.5 MG PO TABS: 2.5000 mg | ORAL_TABLET | Freq: Two times a day (BID) | ORAL | 5 refills | Status: DC

## 2023-05-03 NOTE — Telephone Encounter (Signed)
 Patient was last seen 04/13/23 and per note he was transitioning from Eliquis to Asprin 81 mg. Patient stated that prefers continuing with Eliquis 2.5mg . Patient refill has been sent to pharmacy on file.

## 2023-05-03 NOTE — Telephone Encounter (Signed)
 Patient called. He needs more eliquis. Patient is completely out. [facility] [address] in [place]

## 2023-06-22 ENCOUNTER — Encounter (INDEPENDENT_AMBULATORY_CARE_PROVIDER_SITE_OTHER): Payer: Self-pay

## 2023-08-20 ENCOUNTER — Encounter: Payer: BC Managed Care – PPO | Admitting: Family Medicine

## 2023-10-29 ENCOUNTER — Other Ambulatory Visit (INDEPENDENT_AMBULATORY_CARE_PROVIDER_SITE_OTHER): Payer: Self-pay | Admitting: Vascular Surgery

## 2023-12-28 ENCOUNTER — Telehealth (INDEPENDENT_AMBULATORY_CARE_PROVIDER_SITE_OTHER): Payer: Self-pay

## 2023-12-28 NOTE — Telephone Encounter (Signed)
 Fortunately not advisable to take an aspirin while on Eliquis .  If he is trying to take the aspirin for pain and Tylenol  would better.  If he is taking aspirin because he is concerned he has a blood clot would actually better for him to take 5 mg of Eliquis  twice a day instead of 2.5 mg.

## 2023-12-28 NOTE — Telephone Encounter (Signed)
 Patient reach out to the office stating he notice nagging, dull, irritating pain in the right groin area, hip and lower back. He feels the pain more while sitting and while laying the nagging feeling occurs in the hip.Patient stated this is the same feeling he experience when he had blood clots. The 2nd and 3rd toes on the right foot feeling tingling. Patient is currently on Eliquis  2.5 mg and would like to know if he could take a aspirin also. Patient was l/s 04/13/23. Please Advise

## 2023-12-29 NOTE — Telephone Encounter (Signed)
 Patient was notified with medical recommendations and verbalized understanding. At this time the patient will start taking Eliquis  5mg  twice a day.

## 2024-01-28 ENCOUNTER — Ambulatory Visit
Admission: EM | Admit: 2024-01-28 | Discharge: 2024-01-28 | Disposition: A | Discharge status: Home / Self Care | Attending: Emergency Medicine | Admitting: Emergency Medicine

## 2024-01-28 DIAGNOSIS — R051 Acute cough: Secondary | ICD-10-CM | POA: Diagnosis not present

## 2024-01-28 DIAGNOSIS — J101 Influenza due to other identified influenza virus with other respiratory manifestations: Secondary | ICD-10-CM | POA: Diagnosis not present

## 2024-01-28 LAB — POCT INFLUENZA A/B
Influenza A, POC: NEGATIVE
Influenza B, POC: POSITIVE — AB

## 2024-01-28 LAB — POC SOFIA SARS ANTIGEN FIA: SARS Coronavirus 2 Ag: NEGATIVE

## 2024-01-28 MED ORDER — PROMETHAZINE-DM 6.25-15 MG/5ML PO SYRP: 5.0000 mL | ORAL_SOLUTION | Freq: Four times a day (QID) | ORAL | 0 refills | Status: AC | PRN

## 2024-01-28 MED ORDER — OSELTAMIVIR PHOSPHATE 75 MG PO CAPS: 75.0000 mg | ORAL_CAPSULE | Freq: Two times a day (BID) | ORAL | 0 refills | Status: AC

## 2024-01-28 MED ORDER — BENZONATATE 100 MG PO CAPS: 200.0000 mg | ORAL_CAPSULE | Freq: Three times a day (TID) | ORAL | 0 refills | Status: AC

## 2024-01-28 MED ORDER — IPRATROPIUM BROMIDE 0.06 % NA SOLN: 2.0000 | Freq: Four times a day (QID) | NASAL | 12 refills | Status: AC

## 2024-01-28 NOTE — ED Provider Notes (Signed)
 " [facility]    CSN: [id number] Arrival date & time: 01/28/24  1526      History   Chief Complaint Chief Complaint  Patient presents with   Cough   Nasal Congestion    HPI Benjamin Dyer is a 53 y.o. male.   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  That same day his symptoms started he took a home COVID and flu test that were both negative.  He has not run a fever.  He has had runny nose, nasal congestion, productive cough green sputum, shortness breath, and wheezing.  No known sick contacts or recent travel to state or country.  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   Rectal polyp 05/15/2022   Screening for colon cancer 05/15/2022   DVT (deep venous thrombosis) (HCC) 04/15/2022   Right knee pain 08/08/2021   PE (pulmonary thromboembolism) (HCC) 03/25/2021   Back strain 06/30/2019   Exertional dyspnea 06/30/2018   Rib pain 06/30/2018   Skin lesion 06/30/2018   Encounter for general adult medical examination with abnormal findings 12/23/2015   Asymmetrical hearing loss, right 03/06/2014    Past Surgical History:  Procedure Laterality Date   COLONOSCOPY WITH PROPOFOL  N/A 05/15/2022   Procedure: COLONOSCOPY WITH PROPOFOL ;  Surgeon: Unk Corinn Skiff, MD;  Location: [facility] ENDOSCOPY;  Service: Gastroenterology;  Laterality: N/A;   PULMONARY THROMBECTOMY Bilateral 03/26/2021   Procedure: PULMONARY THROMBECTOMY;  Surgeon: Marea Selinda RAMAN, MD;  Location: [facility] INVASIVE CV LAB;  Service: Cardiovascular;  Laterality: Bilateral;       Home Medications    Prior to Admission medications  Medication Sig Start Date End Date Taking? Authorizing Provider  benzonatate  (TESSALON ) 100 MG capsule Take 2  capsules (200 mg total) by mouth every 8 (eight) hours. 01/28/24  Yes Bernardino Ditch, NP  ELIQUIS  2.5 MG TABS tablet TAKE 1 TABLET BY MOUTH TWICE A DAY 10/29/23  Yes Dew, Jason S, MD  ipratropium (ATROVENT ) 0.06 % nasal spray Place 2 sprays into both nostrils 4 (four) times daily. 01/28/24  Yes Bernardino Ditch, NP  Multiple Vitamin (MULTIVITAMIN) tablet Take 1 tablet by mouth daily.   Yes [provider]  oseltamivir  (TAMIFLU ) 75 MG capsule Take 1 capsule (75 mg total) by mouth every 12 (twelve) hours. 01/28/24  Yes Bernardino Ditch, NP  promethazine -dextromethorphan (PROMETHAZINE -DM) 6.25-15 MG/5ML syrup Take 5 mLs by mouth 4 (four) times daily as needed. 01/28/24  Yes Bernardino Ditch, NP    Family History Family History  Problem Relation Age of Onset   Arthritis Mother    Prostate cancer Father    Mental illness Sister    Diabetes Maternal Uncle    Stroke Paternal Aunt    Leukemia Paternal Uncle     Social History Social History[1]   Allergies   Wound dressing adhesive   Review of Systems Review of Systems  Constitutional:  Negative for fever.  HENT:  Positive for congestion and rhinorrhea. Negative for ear pain.   Respiratory:  Positive for cough, shortness of breath and wheezing.      Physical Exam Triage Vital Signs ED Triage Vitals  Encounter Vitals Group     BP      Girls Systolic BP  Percentile      Girls Diastolic BP Percentile      Boys Systolic BP Percentile      Boys Diastolic BP Percentile      Pulse      Resp      Temp      Temp src      SpO2      Weight      Height      Head Circumference      Peak Flow      Pain Score      Pain Loc      Pain Education      Exclude from Growth Chart    No data found.  Updated Vital Signs BP 123/75 (BP Location: Left Arm)   Pulse 72   Temp 99.5 F (37.5 C) (Oral)   Wt 278 lb (126.1 kg)   SpO2 94%   BMI 34.06 kg/m   Visual Acuity Right Eye Distance:   Left Eye Distance:   Bilateral Distance:    Right  Eye Near:   Left Eye Near:    Bilateral Near:     Physical Exam Vitals and nursing note reviewed.  Constitutional:      Appearance: Normal appearance. He is ill-appearing.  HENT:     Head: Normocephalic and atraumatic.     Right Ear: Tympanic membrane, ear canal and external ear normal. There is no impacted cerumen.     Left Ear: Tympanic membrane, ear canal and external ear normal. There is no impacted cerumen.     Nose: Congestion and rhinorrhea present.     Comments: Patient mucosa is edematous and erythematous with scant clear discharge in both nares.    Mouth/Throat:     Mouth: Mucous membranes are moist.     Pharynx: Oropharynx is clear. Posterior oropharyngeal erythema present. No oropharyngeal exudate.     Comments: Mild erythema to the posterior pharynx with clear postnasal drip. Cardiovascular:     Rate and Rhythm: Normal rate and regular rhythm.     Pulses: Normal pulses.     Heart sounds: Normal heart sounds. No murmur heard.    No friction rub. No gallop.  Pulmonary:     Effort: Pulmonary effort is normal.     Breath sounds: Normal breath sounds. No wheezing, rhonchi or rales.  Musculoskeletal:     Cervical back: Normal range of motion and neck supple. No tenderness.  Lymphadenopathy:     Cervical: No cervical adenopathy.  Skin:    General: Skin is warm and dry.     Capillary Refill: Capillary refill takes less than 2 seconds.     Findings: No rash.  Neurological:     General: No focal deficit present.     Mental Status: He is alert and oriented to person, place, and time.      UC Treatments / Results  Labs (all labs ordered are listed, but only abnormal results are displayed) Labs Reviewed  POCT INFLUENZA A/B - Abnormal; Notable for the following components:      Result Value   Influenza B, POC Positive (*)    All other components within normal limits  POC SOFIA SARS ANTIGEN FIA - Normal    EKG   Radiology No results  found.  Procedures Procedures (including critical care time)  Medications Ordered in UC Medications - No data to display  Initial Impression / Assessment and Plan / UC Course  I have reviewed the triage vital signs and the nursing  notes.  Pertinent labs & imaging results that were available during my care of the patient were reviewed by me and considered in my medical decision making (see chart for details).   Patient is an ill-appearing 53 year old male presenting for evaluation of flulike symptoms that started 2 days ago.  He is here with his wife, who is doing the majority of the talking, who states that his symptoms were very mild on Wednesday when they began.  He took COVID and flu testing that was negative.  Yesterday, they thought it was just a cold and were using over-the-counter cold medication.  This morning when he woke up he felt much worse and his wife is concerned because he has not been moving around much today.  He has a history of DVT and PE and he is on Eliquis .  He has not missed any doses.  Even though he had negative COVID and flu testing at home I will repeated here as he may have tested too early.  Influenza antigen panel was positive for influenza B.  COVID antigen panel is negative.  I will discharge patient on the diagnosis of flu B and start him on Tamiflu  75 mg twice daily for 5 days.  Atrovent  nasal spray for his nasal congestion and Tessalon  Perles Promethazine  DM cough syrup for cough and congestion.  He should continue to use over-the-counter Tylenol  as needed for fever or bodyaches.   Final Clinical Impressions(s) / UC Diagnoses   Final diagnoses:  Acute cough  Influenza B     Discharge Instructions      You have tested positive for influenza B today.  Please continue to use over-the-counter Tylenol  according to the package instructions as needed for any fever or pain.  Take the Tamiflu  twice daily for 5 days for treatment of influenza.  Use the  Atrovent  nasal spray, 2 squirts up each nostril every 6 hours, as needed for nasal congestion and runny nose.  Use over-the-counter Delsym, Zarbee's, or Robitussin during the day as needed for cough.  Use the Tessalon  Perles every 8 hours as needed for cough.  Taken with a small sip of water.  You may experience some numbness to your tongue or metallic taste in your mouth, this is normal.  Use the Promethazine  DM cough syrup at bedtime as will make you drowsy but it should help dry up your postnasal drip and aid you in sleep and cough relief.  Return for reevaluation, or see your primary care provider, for new or worsening symptoms.      ED Prescriptions     Medication Sig Dispense Auth. Provider   benzonatate  (TESSALON ) 100 MG capsule Take 2 capsules (200 mg total) by mouth every 8 (eight) hours. 21 capsule Bernardino Ditch, NP   ipratropium (ATROVENT ) 0.06 % nasal spray Place 2 sprays into both nostrils 4 (four) times daily. 15 mL Bernardino Ditch, NP   promethazine -dextromethorphan (PROMETHAZINE -DM) 6.25-15 MG/5ML syrup Take 5 mLs by mouth 4 (four) times daily as needed. 118 mL Bernardino Ditch, NP   oseltamivir  (TAMIFLU ) 75 MG capsule Take 1 capsule (75 mg total) by mouth every 12 (twelve) hours. 10 capsule Bernardino Ditch, NP      PDMP not reviewed this encounter.     [1]  Social History Tobacco Use   Smoking status: Never   Smokeless tobacco: Never  Vaping Use   Vaping status: Never Used  Substance Use Topics   Alcohol use: No   Drug use: No  Bernardino Ditch, NP 01/28/24 1800  "

## 2024-01-28 NOTE — Discharge Instructions (Addendum)
 You have tested positive for influenza B today.  Please continue to use over-the-counter Tylenol  according to the package instructions as needed for any fever or pain.  Take the Tamiflu  twice daily for 5 days for treatment of influenza.  Use the Atrovent  nasal spray, 2 squirts up each nostril every 6 hours, as needed for nasal congestion and runny nose.  Use over-the-counter Delsym, Zarbee's, or Robitussin during the day as needed for cough.  Use the Tessalon  Perles every 8 hours as needed for cough.  Taken with a small sip of water.  You may experience some numbness to your tongue or metallic taste in your mouth, this is normal.  Use the Promethazine  DM cough syrup at bedtime as will make you drowsy but it should help dry up your postnasal drip and aid you in sleep and cough relief.  Return for reevaluation, or see your primary care provider, for new or worsening symptoms.

## 2024-01-28 NOTE — ED Triage Notes (Addendum)
 Pt is with his wife  Pt c/o cough, fatigue, congestion x3days  Pt states that he has taken tylenol  and mucinex for his symptoms  Pt is on eliquis   Pt states that his symptoms worsened today  Pt took a home covid and flu test and it was negative.

## 2024-04-11 ENCOUNTER — Ambulatory Visit (INDEPENDENT_AMBULATORY_CARE_PROVIDER_SITE_OTHER): Admitting: Vascular Surgery
